# Patient Record
Sex: Female | Born: 1985
Health system: Southern US, Community
[De-identification: ages and names within clinical notes are randomized; demographics above are authoritative.]

## PROBLEM LIST (undated history)

## (undated) DIAGNOSIS — N926 Irregular menstruation, unspecified: Secondary | ICD-10-CM

## (undated) DIAGNOSIS — O139 Gestational [pregnancy-induced] hypertension without significant proteinuria, unspecified trimester: Secondary | ICD-10-CM

## (undated) DIAGNOSIS — D229 Melanocytic nevi, unspecified: Secondary | ICD-10-CM

## (undated) DIAGNOSIS — G43909 Migraine, unspecified, not intractable, without status migrainosus: Secondary | ICD-10-CM

## (undated) DIAGNOSIS — Z3009 Encounter for other general counseling and advice on contraception: Secondary | ICD-10-CM

## (undated) DIAGNOSIS — L732 Hidradenitis suppurativa: Secondary | ICD-10-CM

## (undated) DIAGNOSIS — K589 Irritable bowel syndrome without diarrhea: Secondary | ICD-10-CM

## (undated) HISTORY — DX: Hidradenitis suppurativa: L73.2

## (undated) HISTORY — PX: CHOLECYSTECTOMY: SHX55

## (undated) HISTORY — DX: Encounter for other general counseling and advice on contraception: Z30.09

## (undated) HISTORY — DX: Irregular menstruation, unspecified: N92.6

## (undated) HISTORY — DX: Gestational (pregnancy-induced) hypertension without significant proteinuria, unspecified trimester: O13.9

## (undated) HISTORY — DX: Melanocytic nevi, unspecified: D22.9

---

## 2010-12-12 ENCOUNTER — Other Ambulatory Visit (HOSPITAL_COMMUNITY)
Admission: RE | Admit: 2010-12-12 | Discharge: 2010-12-12 | Disposition: A | Discharge status: Home / Self Care | Payer: Self-pay | Source: Ambulatory Visit | Attending: Obstetrics and Gynecology | Admitting: Obstetrics and Gynecology

## 2010-12-12 DIAGNOSIS — Z01419 Encounter for gynecological examination (general) (routine) without abnormal findings: Secondary | ICD-10-CM | POA: Insufficient documentation

## 2011-06-28 LAB — OB RESULTS CONSOLE ANTIBODY SCREEN: Antibody Screen: NEGATIVE

## 2011-06-28 LAB — OB RESULTS CONSOLE GC/CHLAMYDIA
Chlamydia: NEGATIVE
Gonorrhea: NEGATIVE

## 2011-06-28 LAB — OB RESULTS CONSOLE ABO/RH

## 2012-01-18 LAB — OB RESULTS CONSOLE GC/CHLAMYDIA: Gonorrhea: NEGATIVE

## 2012-01-18 LAB — OB RESULTS CONSOLE GBS: GBS: NEGATIVE

## 2012-01-31 ENCOUNTER — Inpatient Hospital Stay (HOSPITAL_COMMUNITY)
Admission: AD | Admit: 2012-01-31 | Discharge: 2012-02-04 | DRG: 766 | Disposition: A | Discharge status: Home / Self Care | Payer: 59 | Source: Ambulatory Visit | Attending: Obstetrics & Gynecology | Admitting: Obstetrics & Gynecology

## 2012-01-31 ENCOUNTER — Telehealth (HOSPITAL_COMMUNITY): Payer: Self-pay | Admitting: *Deleted

## 2012-01-31 ENCOUNTER — Encounter (HOSPITAL_COMMUNITY): Payer: Self-pay | Admitting: *Deleted

## 2012-01-31 ENCOUNTER — Encounter (HOSPITAL_COMMUNITY): Payer: Self-pay | Admitting: Anesthesiology

## 2012-01-31 DIAGNOSIS — O99892 Other specified diseases and conditions complicating childbirth: Secondary | ICD-10-CM

## 2012-01-31 DIAGNOSIS — K5900 Constipation, unspecified: Secondary | ICD-10-CM | POA: Diagnosis not present

## 2012-01-31 HISTORY — DX: Migraine, unspecified, not intractable, without status migrainosus: G43.909

## 2012-01-31 HISTORY — DX: Irritable bowel syndrome, unspecified: K58.9

## 2012-01-31 LAB — URINALYSIS, ROUTINE W REFLEX MICROSCOPIC
Bilirubin Urine: NEGATIVE
Leukocytes, UA: NEGATIVE
Nitrite: NEGATIVE
Specific Gravity, Urine: 1.025 (ref 1.005–1.030)
Urobilinogen, UA: 0.2 mg/dL (ref 0.0–1.0)

## 2012-01-31 LAB — URINE MICROSCOPIC-ADD ON

## 2012-01-31 LAB — CBC
Hemoglobin: 12.6 g/dL (ref 12.0–15.0)
MCH: 27.2 pg (ref 26.0–34.0)
MCHC: 34.2 g/dL (ref 30.0–36.0)
MCV: 79.5 fL (ref 78.0–100.0)
Platelets: 201 10*3/uL (ref 150–400)
RBC: 4.63 MIL/uL (ref 3.87–5.11)

## 2012-01-31 LAB — ABO/RH: ABO/RH(D): B POS

## 2012-01-31 MED ORDER — TERBUTALINE SULFATE 1 MG/ML IJ SOLN: 0.2500 mg | Freq: Once | INTRAMUSCULAR | Status: AC | PRN

## 2012-01-31 MED ORDER — PHENYLEPHRINE 40 MCG/ML (10ML) SYRINGE FOR IV PUSH (FOR BLOOD PRESSURE SUPPORT)
80.0000 ug | PREFILLED_SYRINGE | INTRAVENOUS | Status: DC | PRN
  Filled 2012-01-31: qty 5

## 2012-01-31 MED ORDER — OXYTOCIN BOLUS FROM INFUSION: 500.0000 mL | INTRAVENOUS | Status: DC

## 2012-01-31 MED ORDER — LACTATED RINGERS IV SOLN: 500.0000 mL | INTRAVENOUS | Status: DC | PRN

## 2012-01-31 MED ORDER — LACTATED RINGERS IV SOLN
INTRAVENOUS | Status: DC
  Administered 2012-01-31 (×2): via INTRAVENOUS

## 2012-01-31 MED ORDER — EPHEDRINE 5 MG/ML INJ
10.0000 mg | INTRAVENOUS | Status: AC | PRN
  Administered 2012-01-31 (×2): 10 mg via INTRAVENOUS
  Filled 2012-01-31: qty 4

## 2012-01-31 MED ORDER — FENTANYL 2.5 MCG/ML BUPIVACAINE 1/10 % EPIDURAL INFUSION (WH - ANES)
14.0000 mL/h | INTRAMUSCULAR | Status: DC
  Filled 2012-01-31: qty 125

## 2012-01-31 MED ORDER — CITRIC ACID-SODIUM CITRATE 334-500 MG/5ML PO SOLN
30.0000 mL | ORAL | Status: DC | PRN
  Administered 2012-02-01: 30 mL via ORAL
  Filled 2012-01-31: qty 15

## 2012-01-31 MED ORDER — OXYCODONE-ACETAMINOPHEN 5-325 MG PO TABS: 1.0000 | ORAL_TABLET | ORAL | Status: DC | PRN

## 2012-01-31 MED ORDER — IBUPROFEN 600 MG PO TABS: 600.0000 mg | ORAL_TABLET | Freq: Four times a day (QID) | ORAL | Status: DC | PRN

## 2012-01-31 MED ORDER — LACTATED RINGERS IV SOLN: 500.0000 mL | Freq: Once | INTRAVENOUS | Status: DC

## 2012-01-31 MED ORDER — ACETAMINOPHEN 325 MG PO TABS: 650.0000 mg | ORAL_TABLET | ORAL | Status: DC | PRN

## 2012-01-31 MED ORDER — EPHEDRINE 5 MG/ML INJ: 10.0000 mg | INTRAVENOUS | Status: DC | PRN

## 2012-01-31 MED ORDER — FENTANYL 2.5 MCG/ML BUPIVACAINE 1/10 % EPIDURAL INFUSION (WH - ANES)
INTRAMUSCULAR | Status: DC | PRN
  Administered 2012-01-31: 14 mL/h via EPIDURAL

## 2012-01-31 MED ORDER — DIPHENHYDRAMINE HCL 50 MG/ML IJ SOLN: 12.5000 mg | INTRAMUSCULAR | Status: DC | PRN

## 2012-01-31 MED ORDER — OXYTOCIN 40 UNITS IN LACTATED RINGERS INFUSION - SIMPLE MED
1.0000 m[IU]/min | INTRAVENOUS | Status: DC
  Administered 2012-02-01: 2 m[IU]/min via INTRAVENOUS

## 2012-01-31 MED ORDER — PHENYLEPHRINE 40 MCG/ML (10ML) SYRINGE FOR IV PUSH (FOR BLOOD PRESSURE SUPPORT): 80.0000 ug | PREFILLED_SYRINGE | INTRAVENOUS | Status: DC | PRN

## 2012-01-31 MED ORDER — OXYTOCIN 40 UNITS IN LACTATED RINGERS INFUSION - SIMPLE MED
62.5000 mL/h | INTRAVENOUS | Status: DC
  Filled 2012-01-31: qty 1000

## 2012-01-31 MED ORDER — LIDOCAINE HCL (PF) 1 % IJ SOLN: 30.0000 mL | INTRAMUSCULAR | Status: DC | PRN

## 2012-01-31 MED ORDER — LIDOCAINE HCL (PF) 1 % IJ SOLN
INTRAMUSCULAR | Status: DC | PRN
  Administered 2012-01-31 (×2): 9 mL

## 2012-01-31 MED ORDER — FLEET ENEMA 7-19 GM/118ML RE ENEM: 1.0000 | ENEMA | RECTAL | Status: DC | PRN

## 2012-01-31 MED ORDER — ONDANSETRON HCL 4 MG/2ML IJ SOLN: 4.0000 mg | Freq: Four times a day (QID) | INTRAMUSCULAR | Status: DC | PRN

## 2012-01-31 NOTE — Anesthesia Preprocedure Evaluation (Signed)
Anesthesia Evaluation  Patient identified by MRN, date of birth, ID band Patient awake    Reviewed: Allergy & Precautions, H&P , NPO status , Patient's Chart, lab work & pertinent test results  Airway Mallampati: III TM Distance: >3 FB Neck ROM: full    Dental No notable dental hx.    Pulmonary neg pulmonary ROS,    Pulmonary exam normal       Cardiovascular     Neuro/Psych negative psych ROS   GI/Hepatic negative GI ROS, Neg liver ROS,   Endo/Other  Morbid obesity  Renal/GU negative Renal ROS  negative genitourinary   Musculoskeletal negative musculoskeletal ROS (+)   Abdominal (+) + obese,   Peds negative pediatric ROS (+)  Hematology negative hematology ROS (+)   Anesthesia Other Findings   Reproductive/Obstetrics (+) Pregnancy                           Anesthesia Physical Anesthesia Plan  ASA: III  Anesthesia Plan: Epidural   Post-op Pain Management:    Induction:   Airway Management Planned:   Additional Equipment:   Intra-op Plan:   Post-operative Plan:   Informed Consent: I have reviewed the patients History and Physical, chart, labs and discussed the procedure including the risks, benefits and alternatives for the proposed anesthesia with the patient or authorized representative who has indicated his/her understanding and acceptance.     Plan Discussed with:   Anesthesia Plan Comments:         Anesthesia Quick Evaluation

## 2012-01-31 NOTE — Progress Notes (Signed)
Mary Pham is a 27 y.o. G1P0000 at [redacted]w[redacted]d by admitted for active labor  Subjective: Doing well, requesting epidural.  Objective: BP 136/78  Pulse 95  Temp 97.7 F (36.5 C) (Oral)  Resp 20  Ht 5' 4.75" (1.645 m)  Wt 122.38 kg (269 lb 12.8 oz)  BMI 45.24 kg/m2  SpO2 100%  Breastfeeding? Unknown      FHT:  FHR: 135 bpm, variability: moderate,  accelerations:  Present,  decelerations:  Absent UC:   irregular, every 4-6 minutes SVE:   Dilation: 6 Effacement (%): 60 Station: -3 Exam by:: L. Leftwich Kirby  Labs: Lab Results  Component Value Date   WBC 12.1* 01/31/2012   HGB 12.6 01/31/2012   HCT 36.8 01/31/2012   MCV 79.5 01/31/2012   PLT 201 01/31/2012    Assessment / Plan: Spontaneous labor, progressing slowly  Labor: progressing slowly, will augment with pitocin after epidural Fetal Wellbeing:  Category I Pain Control:  Epidural I/D:  n/a PIH: Pressures have been doing well, patient is not pre-eclamptic Anticipated MOD:  NSVD  Alita Waldren 01/31/2012, 10:28 PM

## 2012-01-31 NOTE — Telephone Encounter (Signed)
Preadmission screen  

## 2012-01-31 NOTE — MAU Provider Note (Signed)
Chief Complaint:  Labor Eval  HPI: Mary Pham is a 27 y.o. G1P0000 at [redacted]w[redacted]d who presents to maternity admissions reporting cervical dilation and stripping of her membranes early in clinic this AM.  Pt is having contractions about every 5-10 minutes lasting about 30 seconds or so.  No leakage of fluid but is having some spot bleeding.  Denies SOB, abdominal pain, dysuria.    Pregnancy Course: GHTN.  Otherwise uncomplicated.   Past Medical History: Past Medical History  Diagnosis Date  . Pregnancy induced hypertension   . Migraines   . IBS (irritable bowel syndrome)     Past obstetric history: OB History    Grav Para Term Preterm Abortions TAB SAB Ect Mult Living   1 0 0 0 0 0 0 0 0 0      # Outc Date GA Lbr Len/2nd Wgt Sex Del Anes PTL Lv   1 CUR               Past Surgical History: Past Surgical History  Procedure Date  . Cholecystectomy     Family History: Family History  Problem Relation Age of Onset  . Hypertension Father   . Diabetes Father   . Heart disease Maternal Grandmother   . Heart disease Maternal Grandfather   . Cancer Paternal Grandmother     lung  . Stroke Paternal Grandfather     Social History: History  Substance Use Topics  . Smoking status: Never Smoker   . Smokeless tobacco: Never Used  . Alcohol Use: No    Allergies: No Known Allergies  Meds:  Prescriptions prior to admission  Medication Sig Dispense Refill  . butalbital-acetaminophen-caffeine (FIORICET, ESGIC) 50-325-40 MG per tablet Take 1 tablet by mouth 2 (two) times daily as needed.      . cyclobenzaprine (FLEXERIL) 10 MG tablet Take 10 mg by mouth 3 (three) times daily as needed. migrain      . omeprazole (PRILOSEC) 10 MG capsule Take 10 mg by mouth daily.        ROS: Pertinent findings in history of present illness.  Physical Exam  Blood pressure 129/70, pulse 91, temperature 99.4 F (37.4 C), temperature source Oral, resp. rate 20, height 5' 4.75" (1.645 m), weight 122.38 kg  (269 lb 12.8 oz), SpO2 100.00%, unknown if currently breastfeeding. GENERAL: Well-developed, well-nourished female in no acute distress.  HEENT: normocephalic HEART: normal rate RESP: normal effort ABDOMEN: Soft, non-tender, gravid appropriate for gestational age EXTREMITIES: Nontender, no edema NEURO: alert and oriented  Dilation: 3 Effacement (%): 50 Cervical Position: Middle Station: -2 Presentation: Vertex Exam by:: L. Paschal, RN  FHT:  Baseline 145 , moderate variability, accelerations present, no decelerations Contractions: q 3-5 mins   Labs: Results for orders placed during the hospital encounter of 01/31/12 (from the past 24 hour(s))  URINALYSIS, ROUTINE W REFLEX MICROSCOPIC     Status: Abnormal   Collection Time   01/31/12  4:30 PM      Component Value Range   Color, Urine YELLOW  YELLOW   APPearance HAZY (*) CLEAR   Specific Gravity, Urine 1.025  1.005 - 1.030   pH 6.5  5.0 - 8.0   Glucose, UA NEGATIVE  NEGATIVE mg/dL   Hgb urine dipstick MODERATE (*) NEGATIVE   Bilirubin Urine NEGATIVE  NEGATIVE   Ketones, ur 15 (*) NEGATIVE mg/dL   Protein, ur NEGATIVE  NEGATIVE mg/dL   Urobilinogen, UA 0.2  0.0 - 1.0 mg/dL   Nitrite NEGATIVE  NEGATIVE  Leukocytes, UA NEGATIVE  NEGATIVE  URINE MICROSCOPIC-ADD ON     Status: Abnormal   Collection Time   01/31/12  4:30 PM      Component Value Range   Squamous Epithelial / LPF RARE  RARE   WBC, UA 3-6  <3 WBC/hpf   RBC / HPF 3-6  <3 RBC/hpf   Bacteria, UA MANY (*) RARE   Crystals CA OXALATE CRYSTALS (*) NEGATIVE   Urine-Other MUCOUS PRESENT      MAU Course:  1) FHT category one and continues to have intermittent irregular contractions.  Will continue to monitor and possibly admit for labor.    Assessment: Early Labor with stripping of membranes   Plan: Possible admission for progression of labor.  However, if no improvement may send home and come back for IOL scheduled for tomorrow.  Labor precautions and fetal kick  counts    Medication List     As of 01/31/2012  5:58 PM    ASK your doctor about these medications         butalbital-acetaminophen-caffeine 50-325-40 MG per tablet   Commonly known as: FIORICET, ESGIC   Take 1 tablet by mouth 2 (two) times daily as needed.      cyclobenzaprine 10 MG tablet   Commonly known as: FLEXERIL   Take 10 mg by mouth 3 (three) times daily as needed. migrain      omeprazole 10 MG capsule   Commonly known as: PRILOSEC   Take 10 mg by mouth daily.        Briscoe Deutscher, DO 01/31/2012 5:58 PM

## 2012-01-31 NOTE — MAU Note (Signed)
Pt was seen in Dr. Rayna Sexton office this am for appt.  Md stripped membranes and pt had some vaginal bleeding in the office and spotting since the office visit.  U/C's started 1330 and have become more intense and closer together.  No ROM, vaginal spotting only, good fetal movement.

## 2012-01-31 NOTE — H&P (Signed)
Please see MAU provider note for H&P.    Addendum: Pt on repeat cervical exam progressing to 4 cm, 60% effacement.  Having regular contractions.  Will bring in for normal labor and delivery.   Twana First Paulina Fusi, DO of [facility] 01/31/2012, 6:55 PM

## 2012-01-31 NOTE — Anesthesia Procedure Notes (Signed)
Epidural Patient location during procedure: OB Start time: 01/31/2012 10:51 PM End time: 01/31/2012 10:57 PM  Staffing Anesthesiologist: Sandrea Hughs Performed by: anesthesiologist   Preanesthetic Checklist Completed: patient identified, site marked, surgical consent, pre-op evaluation, timeout performed, IV checked, risks and benefits discussed and monitors and equipment checked  Epidural Patient position: sitting Prep: site prepped and draped and DuraPrep Patient monitoring: continuous pulse ox and blood pressure Approach: midline Injection technique: LOR air  Needle:  Needle type: Tuohy  Needle gauge: 17 G Needle length: 9 cm and 9 Needle insertion depth: 8 cm Catheter type: closed end flexible Catheter size: 19 Gauge Catheter at skin depth: 14 cm Test dose: negative and Other  Assessment Sensory level: T8 Events: blood not aspirated, injection not painful, no injection resistance, negative IV test and no paresthesia  Additional Notes Reason for block:procedure for pain

## 2012-01-31 NOTE — Progress Notes (Signed)
Called provider to inform of decels, low b/ps and interventions.  Will wait a little while before starting pitocin per provider

## 2012-02-01 ENCOUNTER — Encounter (HOSPITAL_COMMUNITY)
Admission: AD | Disposition: A | Discharge status: Home / Self Care | Payer: Self-pay | Source: Ambulatory Visit | Attending: Obstetrics & Gynecology

## 2012-02-01 ENCOUNTER — Inpatient Hospital Stay (HOSPITAL_COMMUNITY): Admission: RE | Admit: 2012-02-01 | Payer: 59 | Source: Ambulatory Visit

## 2012-02-01 ENCOUNTER — Encounter (HOSPITAL_COMMUNITY): Payer: Self-pay | Admitting: Anesthesiology

## 2012-02-01 ENCOUNTER — Inpatient Hospital Stay (HOSPITAL_COMMUNITY): Payer: 59 | Admitting: Anesthesiology

## 2012-02-01 ENCOUNTER — Encounter (HOSPITAL_COMMUNITY): Payer: Self-pay | Admitting: *Deleted

## 2012-02-01 DIAGNOSIS — K5900 Constipation, unspecified: Secondary | ICD-10-CM

## 2012-02-01 LAB — URINE CULTURE: Colony Count: NO GROWTH

## 2012-02-01 LAB — RPR: RPR Ser Ql: NONREACTIVE

## 2012-02-01 LAB — PREPARE RBC (CROSSMATCH)

## 2012-02-01 SURGERY — Surgical Case
Anesthesia: Epidural | Site: Abdomen | Wound class: Clean Contaminated

## 2012-02-01 MED ORDER — WITCH HAZEL-GLYCERIN EX PADS: 1.0000 "application " | MEDICATED_PAD | CUTANEOUS | Status: DC | PRN

## 2012-02-01 MED ORDER — TETANUS-DIPHTH-ACELL PERTUSSIS 5-2.5-18.5 LF-MCG/0.5 IM SUSP: 0.5000 mL | Freq: Once | INTRAMUSCULAR | Status: DC

## 2012-02-01 MED ORDER — LACTATED RINGERS IV SOLN
INTRAVENOUS | Status: DC
  Administered 2012-02-01: 14:00:00 via INTRAVENOUS

## 2012-02-01 MED ORDER — PRENATAL MULTIVITAMIN CH
1.0000 | ORAL_TABLET | Freq: Every day | ORAL | Status: DC
  Administered 2012-02-02 – 2012-02-04 (×3): 1 via ORAL
  Filled 2012-02-01 (×3): qty 1

## 2012-02-01 MED ORDER — KETOROLAC TROMETHAMINE 30 MG/ML IJ SOLN: 15.0000 mg | Freq: Once | INTRAMUSCULAR | Status: DC | PRN

## 2012-02-01 MED ORDER — LACTATED RINGERS IV SOLN
INTRAVENOUS | Status: DC | PRN
  Administered 2012-02-01 (×3): via INTRAVENOUS

## 2012-02-01 MED ORDER — OXYTOCIN 40 UNITS IN LACTATED RINGERS INFUSION - SIMPLE MED: 62.5000 mL/h | INTRAVENOUS | Status: AC

## 2012-02-01 MED ORDER — SIMETHICONE 80 MG PO CHEW
80.0000 mg | CHEWABLE_TABLET | ORAL | Status: DC | PRN
  Administered 2012-02-02 – 2012-02-04 (×4): 80 mg via ORAL

## 2012-02-01 MED ORDER — IBUPROFEN 600 MG PO TABS
600.0000 mg | ORAL_TABLET | Freq: Four times a day (QID) | ORAL | Status: DC
  Administered 2012-02-01 – 2012-02-04 (×12): 600 mg via ORAL
  Filled 2012-02-01 (×11): qty 1

## 2012-02-01 MED ORDER — PHENYLEPHRINE 40 MCG/ML (10ML) SYRINGE FOR IV PUSH (FOR BLOOD PRESSURE SUPPORT)
PREFILLED_SYRINGE | INTRAVENOUS | Status: AC
  Filled 2012-02-01: qty 15

## 2012-02-01 MED ORDER — METOCLOPRAMIDE HCL 5 MG/ML IJ SOLN: 10.0000 mg | Freq: Three times a day (TID) | INTRAMUSCULAR | Status: DC | PRN

## 2012-02-01 MED ORDER — KETOROLAC TROMETHAMINE 30 MG/ML IJ SOLN
30.0000 mg | Freq: Four times a day (QID) | INTRAMUSCULAR | Status: AC | PRN
  Administered 2012-02-01: 30 mg via INTRAVENOUS
  Filled 2012-02-01: qty 1

## 2012-02-01 MED ORDER — ONDANSETRON HCL 4 MG PO TABS: 4.0000 mg | ORAL_TABLET | ORAL | Status: DC | PRN

## 2012-02-01 MED ORDER — SODIUM BICARBONATE 8.4 % IV SOLN
INTRAVENOUS | Status: AC
  Filled 2012-02-01: qty 50

## 2012-02-01 MED ORDER — MENTHOL 3 MG MT LOZG: 1.0000 | LOZENGE | OROMUCOSAL | Status: DC | PRN

## 2012-02-01 MED ORDER — MEPERIDINE HCL 25 MG/ML IJ SOLN: 6.2500 mg | INTRAMUSCULAR | Status: DC | PRN

## 2012-02-01 MED ORDER — HYDROMORPHONE HCL PF 1 MG/ML IJ SOLN
0.2500 mg | INTRAMUSCULAR | Status: DC | PRN
  Administered 2012-02-01: 0.5 mg via INTRAVENOUS

## 2012-02-01 MED ORDER — MORPHINE SULFATE 0.5 MG/ML IJ SOLN
INTRAMUSCULAR | Status: AC
  Filled 2012-02-01: qty 10

## 2012-02-01 MED ORDER — ONDANSETRON HCL 4 MG/2ML IJ SOLN
INTRAMUSCULAR | Status: DC | PRN
  Administered 2012-02-01: 4 mg via INTRAVENOUS

## 2012-02-01 MED ORDER — SODIUM CHLORIDE 0.9 % IJ SOLN: 3.0000 mL | INTRAMUSCULAR | Status: DC | PRN

## 2012-02-01 MED ORDER — ONDANSETRON HCL 4 MG/2ML IJ SOLN
INTRAMUSCULAR | Status: AC
  Filled 2012-02-01: qty 2

## 2012-02-01 MED ORDER — MEPERIDINE HCL 25 MG/ML IJ SOLN
INTRAMUSCULAR | Status: AC
  Administered 2012-02-01: 12.5 mg via INTRAVENOUS
  Filled 2012-02-01: qty 1

## 2012-02-01 MED ORDER — MEASLES, MUMPS & RUBELLA VAC ~~LOC~~ INJ
0.5000 mL | INJECTION | Freq: Once | SUBCUTANEOUS | Status: DC
  Filled 2012-02-01: qty 0.5

## 2012-02-01 MED ORDER — LACTATED RINGERS IV SOLN
INTRAVENOUS | Status: DC | PRN
  Administered 2012-02-01: 02:00:00 via INTRAVENOUS

## 2012-02-01 MED ORDER — SCOPOLAMINE 1 MG/3DAYS TD PT72
1.0000 | MEDICATED_PATCH | Freq: Once | TRANSDERMAL | Status: AC
  Administered 2012-02-01: 1.5 mg via TRANSDERMAL

## 2012-02-01 MED ORDER — DIBUCAINE 1 % RE OINT: 1.0000 "application " | TOPICAL_OINTMENT | RECTAL | Status: DC | PRN

## 2012-02-01 MED ORDER — LANOLIN HYDROUS EX OINT: 1.0000 "application " | TOPICAL_OINTMENT | CUTANEOUS | Status: DC | PRN

## 2012-02-01 MED ORDER — DIPHENHYDRAMINE HCL 25 MG PO CAPS: 25.0000 mg | ORAL_CAPSULE | ORAL | Status: DC | PRN

## 2012-02-01 MED ORDER — DIPHENHYDRAMINE HCL 25 MG PO CAPS: 25.0000 mg | ORAL_CAPSULE | Freq: Four times a day (QID) | ORAL | Status: DC | PRN

## 2012-02-01 MED ORDER — OXYTOCIN 10 UNIT/ML IJ SOLN
40.0000 [IU] | INTRAVENOUS | Status: DC | PRN
  Administered 2012-02-01: 40 [IU] via INTRAVENOUS

## 2012-02-01 MED ORDER — MORPHINE SULFATE (PF) 0.5 MG/ML IJ SOLN
INTRAMUSCULAR | Status: DC | PRN
  Administered 2012-02-01 (×2): .5 mg via EPIDURAL

## 2012-02-01 MED ORDER — PROMETHAZINE HCL 25 MG/ML IJ SOLN: 6.2500 mg | INTRAMUSCULAR | Status: DC | PRN

## 2012-02-01 MED ORDER — CEFAZOLIN SODIUM-DEXTROSE 2-3 GM-% IV SOLR
INTRAVENOUS | Status: DC | PRN
  Administered 2012-02-01: 2 g via INTRAVENOUS

## 2012-02-01 MED ORDER — OXYCODONE-ACETAMINOPHEN 5-325 MG PO TABS
1.0000 | ORAL_TABLET | ORAL | Status: DC | PRN
  Administered 2012-02-02 (×2): 1 via ORAL
  Administered 2012-02-02: 2 via ORAL
  Administered 2012-02-03 (×3): 1 via ORAL
  Administered 2012-02-03: 2 via ORAL
  Administered 2012-02-03 – 2012-02-04 (×4): 1 via ORAL
  Filled 2012-02-01 (×4): qty 1
  Filled 2012-02-01 (×2): qty 2
  Filled 2012-02-01 (×6): qty 1

## 2012-02-01 MED ORDER — MORPHINE SULFATE (PF) 0.5 MG/ML IJ SOLN
INTRAMUSCULAR | Status: DC | PRN
  Administered 2012-02-01: 4 mg via EPIDURAL

## 2012-02-01 MED ORDER — NALOXONE HCL 1 MG/ML IJ SOLN
1.0000 ug/kg/h | INTRAVENOUS | Status: DC | PRN
  Filled 2012-02-01: qty 2

## 2012-02-01 MED ORDER — HYDROMORPHONE HCL PF 1 MG/ML IJ SOLN
INTRAMUSCULAR | Status: AC
  Administered 2012-02-01: 0.5 mg via INTRAVENOUS
  Filled 2012-02-01: qty 1

## 2012-02-01 MED ORDER — ONDANSETRON HCL 4 MG/2ML IJ SOLN: 4.0000 mg | INTRAMUSCULAR | Status: DC | PRN

## 2012-02-01 MED ORDER — LIDOCAINE-EPINEPHRINE (PF) 2 %-1:200000 IJ SOLN
INTRAMUSCULAR | Status: AC
  Filled 2012-02-01: qty 20

## 2012-02-01 MED ORDER — OXYTOCIN 10 UNIT/ML IJ SOLN
INTRAMUSCULAR | Status: AC
  Filled 2012-02-01: qty 4

## 2012-02-01 MED ORDER — KETOROLAC TROMETHAMINE 60 MG/2ML IM SOLN
60.0000 mg | Freq: Once | INTRAMUSCULAR | Status: AC | PRN
  Administered 2012-02-01: 60 mg via INTRAMUSCULAR

## 2012-02-01 MED ORDER — MEPERIDINE HCL 25 MG/ML IJ SOLN
6.2500 mg | INTRAMUSCULAR | Status: DC | PRN
  Administered 2012-02-01: 12.5 mg via INTRAVENOUS

## 2012-02-01 MED ORDER — DIPHENHYDRAMINE HCL 50 MG/ML IJ SOLN: 12.5000 mg | INTRAMUSCULAR | Status: DC | PRN

## 2012-02-01 MED ORDER — KETOROLAC TROMETHAMINE 60 MG/2ML IM SOLN
INTRAMUSCULAR | Status: AC
  Administered 2012-02-01: 60 mg via INTRAMUSCULAR
  Filled 2012-02-01: qty 2

## 2012-02-01 MED ORDER — 0.9 % SODIUM CHLORIDE (POUR BTL) OPTIME
TOPICAL | Status: DC | PRN
  Administered 2012-02-01: 1000 mL

## 2012-02-01 MED ORDER — SCOPOLAMINE 1 MG/3DAYS TD PT72
MEDICATED_PATCH | TRANSDERMAL | Status: AC
  Administered 2012-02-01: 1.5 mg via TRANSDERMAL
  Filled 2012-02-01: qty 1

## 2012-02-01 MED ORDER — KETOROLAC TROMETHAMINE 30 MG/ML IJ SOLN: 30.0000 mg | Freq: Four times a day (QID) | INTRAMUSCULAR | Status: AC | PRN

## 2012-02-01 MED ORDER — SODIUM BICARBONATE 8.4 % IV SOLN
INTRAVENOUS | Status: DC | PRN
  Administered 2012-02-01: 10 mL via EPIDURAL

## 2012-02-01 MED ORDER — NALBUPHINE SYRINGE 5 MG/0.5 ML
5.0000 mg | INJECTION | INTRAMUSCULAR | Status: DC | PRN
  Administered 2012-02-01: 10 mg via SUBCUTANEOUS
  Filled 2012-02-01: qty 1

## 2012-02-01 MED ORDER — NALOXONE HCL 0.4 MG/ML IJ SOLN
0.4000 mg | INTRAMUSCULAR | Status: DC | PRN
Start: 2012-02-01 — End: 2012-02-04

## 2012-02-01 MED ORDER — PHENYLEPHRINE HCL 10 MG/ML IJ SOLN
INTRAMUSCULAR | Status: DC | PRN
  Administered 2012-02-01 (×4): 80 ug via INTRAVENOUS

## 2012-02-01 MED ORDER — NALBUPHINE SYRINGE 5 MG/0.5 ML
5.0000 mg | INJECTION | INTRAMUSCULAR | Status: DC | PRN
  Filled 2012-02-01 (×2): qty 1

## 2012-02-01 MED ORDER — DIPHENHYDRAMINE HCL 50 MG/ML IJ SOLN
25.0000 mg | INTRAMUSCULAR | Status: DC | PRN
Start: 2012-02-01 — End: 2012-02-04

## 2012-02-01 MED ORDER — ONDANSETRON HCL 4 MG/2ML IJ SOLN: 4.0000 mg | Freq: Three times a day (TID) | INTRAMUSCULAR | Status: DC | PRN

## 2012-02-01 SURGICAL SUPPLY — 38 items
CLOTH BEACON ORANGE TIMEOUT ST (SAFETY) ×2 IMPLANT
CONTAINER PREFILL 10% NBF 15ML (MISCELLANEOUS) IMPLANT
DRAIN JACKSON PRT FLT 7MM (DRAIN) IMPLANT
DRAPE LG THREE QUARTER DISP (DRAPES) ×2 IMPLANT
DRESSING TELFA 8X3 (GAUZE/BANDAGES/DRESSINGS) IMPLANT
DRSG OPSITE POSTOP 4X10 (GAUZE/BANDAGES/DRESSINGS) ×2 IMPLANT
DURAPREP 26ML APPLICATOR (WOUND CARE) ×2 IMPLANT
ELECT REM PT RETURN 9FT ADLT (ELECTROSURGICAL) ×2
ELECTRODE REM PT RTRN 9FT ADLT (ELECTROSURGICAL) ×1 IMPLANT
EVACUATOR SILICONE 100CC (DRAIN) IMPLANT
EXTRACTOR VACUUM M CUP 4 TUBE (SUCTIONS) IMPLANT
GAUZE SPONGE 4X4 12PLY STRL LF (GAUZE/BANDAGES/DRESSINGS) IMPLANT
GLOVE BIO SURGEON STRL SZ7 (GLOVE) ×2 IMPLANT
GLOVE BIO SURGEON STRL SZ8 (GLOVE) ×2 IMPLANT
GLOVE BIOGEL PI IND STRL 7.0 (GLOVE) ×2 IMPLANT
GLOVE BIOGEL PI INDICATOR 7.0 (GLOVE) ×2
GLOVE ECLIPSE 6.5 STRL STRAW (GLOVE) ×2 IMPLANT
GLOVE INDICATOR 6.5 STRL GRN (GLOVE) ×2 IMPLANT
GLOVE SKINSENSE NS SZ7.5 (GLOVE) ×1
GLOVE SKINSENSE NS SZ8.0 LF (GLOVE) ×1
GLOVE SKINSENSE STRL SZ7.5 (GLOVE) ×1 IMPLANT
GLOVE SKINSENSE STRL SZ8.0 LF (GLOVE) ×1 IMPLANT
GOWN PREVENTION PLUS LG XLONG (DISPOSABLE) ×6 IMPLANT
KIT ABG SYR 3ML LUER SLIP (SYRINGE) ×4 IMPLANT
NEEDLE HYPO 25X5/8 SAFETYGLIDE (NEEDLE) ×4 IMPLANT
NS IRRIG 1000ML POUR BTL (IV SOLUTION) ×2 IMPLANT
PACK C SECTION WH (CUSTOM PROCEDURE TRAY) ×2 IMPLANT
PAD ABD 7.5X8 STRL (GAUZE/BANDAGES/DRESSINGS) ×2 IMPLANT
PAD OB MATERNITY 4.3X12.25 (PERSONAL CARE ITEMS) ×2 IMPLANT
RTRCTR C-SECT PINK 25CM LRG (MISCELLANEOUS) ×2 IMPLANT
SLEEVE SCD COMPRESS KNEE MED (MISCELLANEOUS) ×2 IMPLANT
STAPLER VISISTAT 35W (STAPLE) ×2 IMPLANT
SUT VIC AB 0 CTX 36 (SUTURE) ×5
SUT VIC AB 0 CTX36XBRD ANBCTRL (SUTURE) ×5 IMPLANT
SUT VIC AB 4-0 KS 27 (SUTURE) IMPLANT
TOWEL OR 17X24 6PK STRL BLUE (TOWEL DISPOSABLE) ×6 IMPLANT
TRAY FOLEY CATH 14FR (SET/KITS/TRAYS/PACK) ×2 IMPLANT
WATER STERILE IRR 1000ML POUR (IV SOLUTION) IMPLANT

## 2012-02-01 NOTE — Progress Notes (Signed)
This note also relates to the following rows which could not be included: Pulse Rate - Cannot attach notes to unvalidated device data SpO2 - Cannot attach notes to unvalidated device data  Does not seem to be a bag of water questionable rupture time???

## 2012-02-01 NOTE — Progress Notes (Signed)
Adj [place] trying to find baby

## 2012-02-01 NOTE — Progress Notes (Signed)
I have seen this patient and agree with the above resident's note.  LEFTWICH-KIRBY, Ngina Royer Certified Nurse-Midwife 

## 2012-02-01 NOTE — Transfer of Care (Signed)
Immediate Anesthesia Transfer of Care Note  Patient: Mary Pham  Procedure(s) Performed: Procedure(s) (LRB) with comments: CESAREAN SECTION (N/A)  Patient Location: PACU  Anesthesia Type:Epidural  Level of Consciousness: awake, alert  and oriented  Airway & Oxygen Therapy: Patient Spontanous Breathing  Post-op Assessment: Report given to PACU RN  Post vital signs: Reviewed and stable  Complications: No apparent anesthesia complications

## 2012-02-01 NOTE — Anesthesia Postprocedure Evaluation (Signed)
  Anesthesia Post-op Note  Patient: Mary Pham  Procedure(s) Performed: Procedure(s) (LRB) with comments: CESAREAN SECTION (N/A) - Primary Cesarean Section Delivery Baby Boy @ 0221, Apgars 7/9  Patient Location: Mother/Baby  Anesthesia Type:Epidural  Level of Consciousness: awake, alert  and oriented  Airway and Oxygen Therapy: Patient Spontanous Breathing  Post-op Pain: mild  Post-op Assessment: Post-op Vital signs reviewed, Patient's Cardiovascular Status Stable, No headache, No backache, No residual numbness and No residual motor weakness  Post-op Vital Signs: Reviewed and stable  Complications: No apparent anesthesia complications

## 2012-02-01 NOTE — Progress Notes (Signed)
Mary Pham is a 27 y.o. G1P1001 at [redacted]w[redacted]d admitted for active labor  Subjective: Pt comfortable with epidural, family members at bedside for support.  Objective: BP 121/59  Pulse 91  Temp 97.6 F (36.4 C) (Oral)  Resp 19  Ht 5' 4.75" (1.645 m)  Wt 122.38 kg (269 lb 12.8 oz)  BMI 45.24 kg/m2  SpO2 99%  Breastfeeding? Unknown   Total I/O In: 1600 [I.V.:1600] Out: 900 [Urine:100; Blood:800]  FHT:  FHR: 135 bpm, variability: moderate,  accelerations:  Present,  decelerations:  Present RN called CNM and resident to room r/t fetal bradycardia vs tracing MHR.  FHR in 80s-90s at time of arrival.  UC:   regular, every 2-4 minutes SVE:   Dilation: 8 Effacement (%): 80 Station: -3 Exam by:: Leftwich kirby CNM Attempt to AROM, no fetal membranes palpable, no fluid noted, but pt having heavy bloody show. Scalp electrode placed, positive scalp stimulation with FHR 120s for 1.5-2 minutes, then back down to 80s, Dr Penne Lash paged to room.  C/S called by Dr Penne Lash when FHR remained in 80s.   Labs: Lab Results  Component Value Date   WBC 12.1* 01/31/2012   HGB 12.6 01/31/2012   HCT 36.8 01/31/2012   MCV 79.5 01/31/2012   PLT 201 01/31/2012    Assessment / Plan: Fetal bradycardia  To OR for stat C/S  LEFTWICH-KIRBY, LISA 02/01/2012, 3:56 AM

## 2012-02-01 NOTE — Consult Note (Signed)
Neonatology Note:   Attendance at C-section:    I was asked to attend this Stat primary C/S at 39 weks due to fetal bradycardia lasting 15 minutes (FHR in 70-80 range). The mother is a G1P0 B pos, GBS neg with chronic HTN, on no medication. ROM at delivery, fluid clear. Infant coughed once while the cord was being clamped, but appeared floppy, blue and without spontaneous respirations at birth.we quickly bulb suctioned for small, bloody secretions and gave vigorous stimulation. He responded promptly with grimace, then crying, improvement in color, and tone. His tone normalized by 5 minutes of life and he remained pink and without distress. Ap 7/9. Lungs clear to ausc in DR. Mild syndactyly variant of right toes 2-3 noted (father says he has webbed toes). To CN to care of Pediatrician.   Deatra James, MD

## 2012-02-01 NOTE — Op Note (Signed)
Mary Pham PROCEDURE DATE: 01/31/2012 - 02/01/2012  PREOPERATIVE DIAGNOSIS: Intrauterine pregnancy at  [redacted]w[redacted]d weeks gestation  POSTOPERATIVE DIAGNOSIS: The same  PROCEDURE:    Low Transverse Cesarean Section  SURGEON:  Dr. Elsie Lincoln  ASSISTANT: Dr. Tawanna Cooler Meisinger  INDICATIONS: Mary Pham is a 27 y.o. G1P1001 at [redacted]w[redacted]d with fetal bradycardia.  The risks of cesarean section discussed with the patient included but were not limited to: bleeding which may require transfusion or reoperation; infection which may require antibiotics; injury to bowel, bladder, ureters or other surrounding organs; injury to the fetus; need for additional procedures including hysterectomy in the event of a life-threatening hemorrhage; placental abnormalities wth subsequent pregnancies, incisional problems, thromboembolic phenomenon and other postoperative/anesthesia complications. The patient concurred with the proposed plan, giving informed written consent for the procedure.    FINDINGS:  Viable female infant in cephalic presentation, clear amniotic fluid.  Intact placenta, three vessel cord.  Grossly normal uterus, ovaries and fallopian tubes.  There was a small amount of clots upon entering the uterus which can be clinical  Signs of abruption .   ANESTHESIA:    Epidural ESTIMATED BLOOD LOSS: 800 ml SPECIMENS: Placenta sent to pathology COMPLICATIONS: None immediate  PROCEDURE IN DETAIL:  The patient received intravenous antibiotics and had sequential compression devices applied to her lower extremities while in the preoperative area.  She was then taken to the operating room where epidural anesthesia was dosed up to surgical level and was found to be adequate. She was then placed in a dorsal supine position with a leftward tilt, and prepped and draped in a sterile manner.  A foley catheter was placed into her bladder and attached to constant gravity.  After an adequate timeout was performed, a Pfannenstiel skin incision  was made with scalpel and carried through to the underlying layer of fascia. The fascia was incised in the midline and this incision was extended bilaterally using the Mayo scissors. Kocher clamps were applied to the superior aspect of the fascial incision and the underlying rectus muscles were dissected off bluntly. A similar process was carried out on the inferior aspect of the facial incision. The rectus muscles were separated in the midline bluntly and the peritoneum was entered bluntly.   A transverse hysterotomy was made with a scalpel and extended bilaterally bluntly. The bladder blade was then removed. The infant was successfully delivered, and cord was clamped and cut and infant was handed over to awaiting neonatology team. Uterine massage was then administered and the placenta delivered intact with three-vessel cord. The uterus was cleared of clot and debris.  There was an extension of the right side of the uterine incision which was clearly identified and repaired with 0-Vicryl.  The hysterotomy was closed with 0 vicryl.  There was good hemostasis at the end of the procedure.  The peritoneum and rectus muscles were noted to be hemostatic.  The fascia was closed with 0-Vicryl in a running fashion with good restoration of anatomy.  The subcutaneus tissue was copiously irrigated and re approximated with 0 plain suture.  The skin was closed with staples.  Pt tolerated the procedure will.  All counts were correct x2.  Pt went to the recovery room in stable condition.

## 2012-02-01 NOTE — Addendum Note (Signed)
Addendum  created 02/01/12 0755 by Graciela Husbands, CRNA   Modules edited:Notes Section

## 2012-02-01 NOTE — H&P (Signed)
Agree with above resident note. Napoleon Form, MD

## 2012-02-01 NOTE — MAU Provider Note (Signed)
I reviewed above note and fetal monitoring strip and agree with assessment and plan. Napoleon Form, MD

## 2012-02-01 NOTE — Progress Notes (Signed)
This note also relates to the following rows which could not be included: Pulse Rate - Cannot attach notes to unvalidated device data SpO2 - Cannot attach notes to unvalidated device data  Dr. Penne Lash in room, calls for STAT C-section due to fetal bradycardia

## 2012-02-01 NOTE — Anesthesia Postprocedure Evaluation (Signed)
Anesthesia Post Note  Patient: Mary Pham  Procedure(s) Performed: Procedure(s) (LRB): CESAREAN SECTION (N/A)  Anesthesia type: Epidural  Patient location: PACU  Post pain: Pain level controlled  Post assessment: Post-op Vital signs reviewed  Last Vitals:  Filed Vitals:   02/01/12 0345  BP: 121/59  Pulse: 91  Temp:   Resp: 19    Post vital signs: Reviewed  Level of consciousness: awake  Complications: No apparent anesthesia complications

## 2012-02-02 LAB — CBC
HCT: 31.5 % — ABNORMAL LOW (ref 36.0–46.0)
MCH: 26.6 pg (ref 26.0–34.0)
MCHC: 32.7 g/dL (ref 30.0–36.0)
MCV: 81.4 fL (ref 78.0–100.0)
RDW: 15.7 % — ABNORMAL HIGH (ref 11.5–15.5)

## 2012-02-02 NOTE — Progress Notes (Signed)
Subjective: Postpartum Day 1: Cesarean Delivery Patient reports incisional pain, tolerating PO and no problems voiding.    Objective: Vital signs in last 24 hours: Temp:  [97.6 F (36.4 C)-98.7 F (37.1 C)] 98.4 F (36.9 C) (01/11 0544) Pulse Rate:  [70-100] 75  (01/11 0544) Resp:  [18-20] 18  (01/11 0544) BP: (103-136)/(64-79) 119/75 mmHg (01/11 0544) SpO2:  [97 %-98 %] 98 % (01/11 0200)  Physical Exam:  General: alert, cooperative and no distress Lochia: appropriate Uterine Fundus: firm Incision: dressing dry DVT Evaluation: No evidence of DVT seen on physical exam.   Basename 02/02/12 0500 01/31/12 1924  HGB 10.2* 12.6  HCT 31.5* 36.8    Assessment/Plan: Status post Cesarean section. Doing well postoperatively.  Continue current care Probably go home tomorrow.  Reyansh Kushnir H 02/02/2012, 7:14 AM

## 2012-02-03 MED ORDER — DOCUSATE SODIUM 100 MG PO CAPS
100.0000 mg | ORAL_CAPSULE | Freq: Two times a day (BID) | ORAL | Status: DC | PRN
  Administered 2012-02-03 (×2): 100 mg via ORAL
  Filled 2012-02-03 (×2): qty 1

## 2012-02-03 NOTE — Progress Notes (Signed)
Subjective: Postpartum Day 2: Cesarean Delivery Patient reports incisional pain, tolerating PO and no problems voiding. Has not had BM; has IBS but would like stool softener. Bleeding is decreasing, no clots. Baby in NICU for seizures; mom would like to stay till tomorrow.  Objective: Vital signs in last 24 hours: Temp:  [97.7 F (36.5 C)-98.3 F (36.8 C)] 97.7 F (36.5 C) (01/12 0545) Pulse Rate:  [84-101] 101  (01/12 0545) Resp:  [18-20] 20  (01/12 0545) BP: (113-141)/(80-85) 141/85 mmHg (01/12 0545)  Physical Exam:  General: alert, cooperative and no distress Lochia: appropriate Uterine Fundus: firm Incision: healing well, no significant drainage, no dehiscence, no significant erythema DVT Evaluation: No evidence of DVT seen on physical exam. Negative Homan's sign. No cords or calf tenderness. Calf/Ankle edema is present.   Basename 02/02/12 0500 01/31/12 1924  HGB 10.2* 12.6  HCT 31.5* 36.8    Assessment/Plan: Status post Cesarean section. Postoperative course complicated by constipation  Continue current care. Will start colace. Discharge tomorrow.  Napoleon Form 02/03/2012, 8:30 AM

## 2012-02-04 ENCOUNTER — Encounter (HOSPITAL_COMMUNITY): Payer: Self-pay | Admitting: Obstetrics & Gynecology

## 2012-02-04 LAB — TYPE AND SCREEN: Unit division: 0

## 2012-02-04 MED ORDER — IBUPROFEN 600 MG PO TABS: 600.0000 mg | ORAL_TABLET | Freq: Four times a day (QID) | ORAL | Status: DC

## 2012-02-04 MED ORDER — DSS 100 MG PO CAPS: 100.0000 mg | ORAL_CAPSULE | Freq: Two times a day (BID) | ORAL | Status: DC | PRN

## 2012-02-04 MED ORDER — OXYCODONE-ACETAMINOPHEN 5-325 MG PO TABS: 1.0000 | ORAL_TABLET | ORAL | Status: DC | PRN

## 2012-02-04 MED ORDER — DIBUCAINE 1 % RE OINT: 1.0000 "application " | TOPICAL_OINTMENT | RECTAL | Status: DC | PRN

## 2012-02-04 NOTE — Discharge Summary (Signed)
Mary Pham is a 27 y.o. G1P0000 at [redacted]w[redacted]d who presented to maternity admissions reporting cervical dilation and stripping of her membranes early in clinic that previous morning.  Pt was monitored with FHT and was found to have persistent fetal bradycardia and taking for LTCS.  Pt did well with epidural and EBL about 800 mL.  Mom wants to breast feed.    Obstetric Discharge Summary Reason for Admission: onset of labor Prenatal Procedures: none Intrapartum Procedures: cesarean: low cervical, transverse Postpartum Procedures: none Complications-Operative and Postpartum: none Hemoglobin  Date Value Range Status  02/02/2012 10.2* 12.0 - 15.0 g/dL Final     REPEATED TO VERIFY     DELTA CHECK NOTED     HCT  Date Value Range Status  02/02/2012 31.5* 36.0 - 46.0 % Final    Physical Exam:  General: alert, cooperative and appears stated age Lochia: appropriate Uterine Fundus: firm Incision: healing well DVT Evaluation: No evidence of DVT seen on physical exam. Negative Homan's sign. No significant calf/ankle edema.  Discharge Diagnoses: Term Pregnancy-delivered  Discharge Information: Date: 02/04/2012 Activity: pelvic rest Diet: routine Medications: Colace and Percocet Condition: stable Instructions: refer to practice specific booklet Discharge to: home   Newborn Data: Live born female  Birth Weight: 6 lb 4.5 oz (2850 g) APGAR: 7, 9  Baby in NICU for seizures   Gildardo Cranker 02/04/2012, 7:24 AM

## 2012-02-04 NOTE — Progress Notes (Signed)
CSW attempted to meet with parents to offer support, complete assessment for NICU admission and discuss possible SSI eligibility, but they were busy with LC.  CSW to attempt again at a later time.

## 2012-06-18 ENCOUNTER — Other Ambulatory Visit: Payer: Self-pay | Admitting: Obstetrics & Gynecology

## 2012-06-18 DIAGNOSIS — O3680X0 Pregnancy with inconclusive fetal viability, not applicable or unspecified: Secondary | ICD-10-CM

## 2012-06-18 DIAGNOSIS — O34219 Maternal care for unspecified type scar from previous cesarean delivery: Secondary | ICD-10-CM

## 2012-06-19 ENCOUNTER — Encounter: Payer: Self-pay | Admitting: *Deleted

## 2012-06-20 ENCOUNTER — Ambulatory Visit (INDEPENDENT_AMBULATORY_CARE_PROVIDER_SITE_OTHER): Payer: 59 | Admitting: Obstetrics & Gynecology

## 2012-06-20 ENCOUNTER — Encounter: Payer: Self-pay | Admitting: Obstetrics & Gynecology

## 2012-06-20 ENCOUNTER — Other Ambulatory Visit: Payer: Self-pay

## 2012-06-20 ENCOUNTER — Ambulatory Visit (INDEPENDENT_AMBULATORY_CARE_PROVIDER_SITE_OTHER): Payer: 59

## 2012-06-20 DIAGNOSIS — O200 Threatened abortion: Secondary | ICD-10-CM

## 2012-06-20 DIAGNOSIS — O34219 Maternal care for unspecified type scar from previous cesarean delivery: Secondary | ICD-10-CM

## 2012-06-20 DIAGNOSIS — O3680X0 Pregnancy with inconclusive fetal viability, not applicable or unspecified: Secondary | ICD-10-CM

## 2012-06-20 NOTE — Addendum Note (Signed)
Addended by: Malachy Mood S on: 06/20/2012 02:00 PM   Modules accepted: Orders

## 2012-06-20 NOTE — Addendum Note (Signed)
Addended by: Colen Darling on: 06/20/2012 01:50 PM   Modules accepted: Orders

## 2012-06-20 NOTE — Progress Notes (Signed)
Had HCG 2 weeks ago with other labs, sonogram revels empty uterus.  Recheck quant HCG today and follow up

## 2012-06-21 LAB — HCG, QUANTITATIVE, PREGNANCY: hCG, Beta Chain, Quant, S: <2 m[IU]/mL

## 2012-06-23 ENCOUNTER — Telehealth: Payer: Self-pay | Admitting: Obstetrics & Gynecology

## 2012-06-23 NOTE — Telephone Encounter (Signed)
I called patient and told her HCG negative

## 2012-07-02 LAB — US OB TRANSVAGINAL

## 2012-10-09 ENCOUNTER — Encounter: Payer: Self-pay | Admitting: Adult Health

## 2012-10-09 ENCOUNTER — Ambulatory Visit (INDEPENDENT_AMBULATORY_CARE_PROVIDER_SITE_OTHER): Payer: 59 | Admitting: Adult Health

## 2012-10-09 VITALS — BP 120/80 | Ht 65.0 in | Wt 252.0 lb

## 2012-10-09 DIAGNOSIS — Z3009 Encounter for other general counseling and advice on contraception: Secondary | ICD-10-CM

## 2012-10-09 DIAGNOSIS — N926 Irregular menstruation, unspecified: Secondary | ICD-10-CM

## 2012-10-09 DIAGNOSIS — Z3202 Encounter for pregnancy test, result negative: Secondary | ICD-10-CM

## 2012-10-09 HISTORY — DX: Irregular menstruation, unspecified: N92.6

## 2012-10-09 HISTORY — DX: Encounter for other general counseling and advice on contraception: Z30.09

## 2012-10-09 LAB — POCT URINE PREGNANCY: Preg Test, Ur: NEGATIVE

## 2012-10-09 LAB — COMPREHENSIVE METABOLIC PANEL
Albumin: 3.8 g/dL (ref 3.5–5.2)
Alkaline Phosphatase: 85 U/L (ref 39–117)
Glucose, Bld: 83 mg/dL (ref 70–99)
Potassium: 4.3 mEq/L (ref 3.5–5.3)
Sodium: 137 mEq/L (ref 135–145)
Total Protein: 7 g/dL (ref 6.0–8.3)

## 2012-10-09 LAB — CBC
Hemoglobin: 13.2 g/dL (ref 12.0–15.0)
MCHC: 33.9 g/dL (ref 30.0–36.0)
RBC: 5.14 MIL/uL — ABNORMAL HIGH (ref 3.87–5.11)

## 2012-10-09 NOTE — Patient Instructions (Addendum)
Ethinyl Estradiol; Etonogestrel vaginal ring What is this medicine? ETHINYL ESTRADIOL; ETONOGESTREL (ETH in il es tra DYE ole; et oh noe JES trel) vaginal ring is a flexible, vaginal ring used as a contraceptive (birth control method). This medicine combines two types of female hormones, an estrogen and a progestin. This ring is used to prevent ovulation and pregnancy. Each ring is effective for one month. This medicine may be used for other purposes; ask your health care provider or pharmacist if you have questions. What should I tell my health care provider before I take this medicine? They need to know if you have or ever had any of these conditions: -abnormal vaginal bleeding -blood vessel disease or blood clots -breast, cervical, endometrial, ovarian, liver, or uterine cancer -diabetes -gallbladder disease -heart disease or recent heart attack -high blood pressure -high cholesterol -kidney disease -liver disease -migraine headaches -stroke -systemic lupus erythematosus (SLE) -tobacco smoker -an unusual or allergic reaction to estrogens, progestins, other medicines, foods, dyes, or preservatives -pregnant or trying to get pregnant -breast-feeding How should I use this medicine? Insert the ring into your vagina as directed. Follow the directions on the prescription label. The ring will remain place for 3 weeks and is then removed for a 1-week break. A new ring is inserted 1 week after the last ring was removed, on the same day of the week. Do not use more often than directed. A patient package insert for the product will be given with each prescription and refill. Read this sheet carefully each time. The sheet may change frequently. Contact your pediatrician regarding the use of this medicine in children. Special care may be needed. This medicine has been used in female children who have started having menstrual periods. Overdosage: If you think you have taken too much of this medicine  contact a poison control center or emergency room at once. NOTE: This medicine is only for you. Do not share this medicine with others. What if I miss a dose? You will need to replace your vaginal ring once a month as directed. If the ring should slip out, or if you leave it in longer or shorter than you should, contact your health care professional for advice. What may interact with this medicine? -acetaminophen -antibiotics or medicines for infections, especially rifampin, rifabutin, rifapentine, and griseofulvin, and possibly penicillins or tetracyclines -aprepitant -ascorbic acid (vitamin C) -atorvastatin -barbiturate medicines, such as phenobarbital -bosentan -carbamazepine -caffeine -clofibrate -cyclosporine -dantrolene -doxercalciferol -felbamate -grapefruit juice -hydrocortisone -medicines for anxiety or sleeping problems, such as diazepam or temazepam -medicines for diabetes, including pioglitazone -modafinil -mycophenolate -nefazodone -oxcarbazepine -phenytoin -prednisolone -ritonavir or other medicines for HIV infection or AIDS -rosuvastatin -selegiline -soy isoflavones supplements -St. John's wort -tamoxifen or raloxifene -theophylline -thyroid hormones -topiramate -warfarin This list may not describe all possible interactions. Give your health care provider a list of all the medicines, herbs, non-prescription drugs, or dietary supplements you use. Also tell them if you smoke, drink alcohol, or use illegal drugs. Some items may interact with your medicine. What should I watch for while using this medicine? Visit your doctor or health care professional for regular checks on your progress. You will need a regular breast and pelvic exam and Pap smear while on this medicine. Use an additional method of contraception during the first cycle that you use this ring. If you have any reason to think you are pregnant, stop using this medicine right away and contact your  doctor or health care professional. If you are using this   medicine for hormone related problems, it may take several cycles of use to see improvement in your condition. Smoking increases the risk of getting a blood clot or having a stroke while you are using hormonal birth control, especially if you are more than 25 years old. You are strongly advised not to smoke. This medicine can make your body retain fluid, making your fingers, hands, or ankles swell. Your blood pressure can go up. Contact your doctor or health care professional if you feel you are retaining fluid. This medicine can make you more sensitive to the sun. Keep out of the sun. If you cannot avoid being in the sun, wear protective clothing and use sunscreen. Do not use sun lamps or tanning beds/booths. If you wear contact lenses and notice visual changes, or if the lenses begin to feel uncomfortable, consult your eye care specialist. In some women, tenderness, swelling, or minor bleeding of the gums may occur. Notify your dentist if this happens. Brushing and flossing your teeth regularly may help limit this. See your dentist regularly and inform your dentist of the medicines you are taking. If you are going to have elective surgery, you may need to stop using this medicine before the surgery. Consult your health care professional for advice. This medicine does not protect you against HIV infection (AIDS) or any other sexually transmitted diseases. What side effects may I notice from receiving this medicine? Side effects that you should report to your doctor or health care professional as soon as possible: -breast tissue changes or discharge -changes in vaginal bleeding during your period or between your periods -chest pain -coughing up blood -dizziness or fainting spells -headaches or migraines -leg, arm or groin pain -severe or sudden headaches -stomach pain (severe) -sudden shortness of breath -sudden loss of coordination,  especially on one side of the body -speech problems -symptoms of vaginal infection like itching, irritation or unusual discharge -tenderness in the upper abdomen -vomiting -weakness or numbness in the arms or legs, especially on one side of the body -yellowing of the eyes or skin Side effects that usually do not require medical attention (report to your doctor or health care professional if they continue or are bothersome): -breakthrough bleeding and spotting that continues beyond the 3 initial cycles of pills -breast tenderness -mood changes, anxiety, depression, frustration, anger, or emotional outbursts -increased sensitivity to sun or ultraviolet light -nausea -skin rash, acne, or brown spots on the skin -weight gain (slight) This list may not describe all possible side effects. Call your doctor for medical advice about side effects. You may report side effects to FDA at 1-800-FDA-1088. Where should I keep my medicine? Keep out of the reach of children. Store at room temperature between 15 and 30 degrees C (59 and 86 degrees F) for up to 4 months. The product will expire after 4 months. Protect from light. Throw away any unused medicine after the expiration date. NOTE: This sheet is a summary. It may not cover all possible information. If you have questions about this medicine, talk to your doctor, pharmacist, or health care provider.  2013, [employer]. (12/25/2007 12:03:58 PM) Etonogestrel implant What is this medicine? ETONOGESTREL is a contraceptive (birth control) device. It is used to prevent pregnancy. It can be used for up to 3 years. This medicine may be used for other purposes; ask your health care provider or pharmacist if you have questions. What should I tell my health care provider before I take this medicine? They need to  know if you have any of these conditions: -abnormal vaginal bleeding -blood vessel disease or blood clots -cancer of the breast, cervix,  or liver -depression -diabetes -gallbladder disease -headaches -heart disease or recent heart attack -high blood pressure -high cholesterol -kidney disease -liver disease -renal disease -seizures -tobacco smoker -an unusual or allergic reaction to etonogestrel, other hormones, anesthetics or antiseptics, medicines, foods, dyes, or preservatives -pregnant or trying to get pregnant -breast-feeding How should I use this medicine? This device is inserted just under the skin on the inner side of your upper arm by a health care professional. Talk to your pediatrician regarding the use of this medicine in children. Special care may be needed. Overdosage: If you think you've taken too much of this medicine contact a poison control center or emergency room at once. Overdosage: If you think you have taken too much of this medicine contact a poison control center or emergency room at once. NOTE: This medicine is only for you. Do not share this medicine with others. What if I miss a dose? This does not apply. What may interact with this medicine? Do not take this medicine with any of the following medications: -amprenavir -bosentan -fosamprenavir This medicine may also interact with the following medications: -barbiturate medicines for inducing sleep or treating seizures -certain medicines for fungal infections like ketoconazole and itraconazole -griseofulvin -medicines to treat seizures like carbamazepine, felbamate, oxcarbazepine, phenytoin, topiramate -modafinil -phenylbutazone -rifampin -some medicines to treat HIV infection like atazanavir, indinavir, lopinavir, nelfinavir, tipranavir, ritonavir -St. John's wort This list may not describe all possible interactions. Give your health care provider a list of all the medicines, herbs, non-prescription drugs, or dietary supplements you use. Also tell them if you smoke, drink alcohol, or use illegal drugs. Some items may interact with your  medicine. What should I watch for while using this medicine? This product does not protect you against HIV infection (AIDS) or other sexually transmitted diseases. You should be able to feel the implant by pressing your fingertips over the skin where it was inserted. Tell your doctor if you cannot feel the implant. What side effects may I notice from receiving this medicine? Side effects that you should report to your doctor or health care professional as soon as possible: -allergic reactions like skin rash, itching or hives, swelling of the face, lips, or tongue -breast lumps -changes in vision -confusion, trouble speaking or understanding -dark urine -depressed mood -general ill feeling or flu-like symptoms -light-colored stools -loss of appetite, nausea -right upper belly pain -severe headaches -severe pain, swelling, or tenderness in the abdomen -shortness of breath, chest pain, swelling in a leg -signs of pregnancy -sudden numbness or weakness of the face, arm or leg -trouble walking, dizziness, loss of balance or coordination -unusual vaginal bleeding, discharge -unusually weak or tired -yellowing of the eyes or skin Side effects that usually do not require medical attention (Report these to your doctor or health care professional if they continue or are bothersome.): -acne -breast pain -changes in weight -cough -fever or chills -headache -irregular menstrual bleeding -itching, burning, and vaginal discharge -pain or difficulty passing urine -sore throat This list may not describe all possible side effects. Call your doctor for medical advice about side effects. You may report side effects to FDA at 1-800-FDA-1088. Where should I keep my medicine? This drug is given in a hospital or clinic and will not be stored at home. NOTE: This sheet is a summary. It may not cover all possible information. If you  have questions about this medicine, talk to your doctor, pharmacist, or  health care provider.  2013, Elsevier/Gold Standard. (10/01/2008 3:54:17 PM) Pt  To call once decision made about [facility] Use condoms

## 2012-10-09 NOTE — Progress Notes (Signed)
Subjective:     Patient ID: Mary Pham, female   DOB: 01-Oct-1985, 27 y.o.   MRN: [id number]  HPI Mary Pham is a 27 year old white female married in complaining of irregular bleeding on sprintec so she stopped it and is using condoms. She has been on since 8/31 and has some clots.She is interested in nexplanon or nuva ring.  Review of Systems See HPI Reviewed past medical,surgical, social and family history. Reviewed medications and allergies.     Objective:   Physical Exam BP 120/80  Ht 5\' 5"  (1.651 m)  Wt 252 lb (114.306 kg)  BMI 41.93 kg/m2  LMP 09/21/2012  Breastfeeding? Nourine pregnancy test negative.Desires Birth control and we discussed options, and she is thinking nexplanon or ring.    Assessment:     Irregular bleeding Contraceptive counseling    Plan:     Check CBC,CMP and TSH   Review handout on nexplanon and ring, and check insurance We will talk in am and she will call when she decides Use condoms for now

## 2012-10-10 ENCOUNTER — Telehealth: Payer: Self-pay | Admitting: Adult Health

## 2012-10-10 NOTE — Telephone Encounter (Signed)
Pt aware of labs  

## 2012-10-13 ENCOUNTER — Encounter: Payer: Self-pay | Admitting: Women's Health

## 2012-10-13 ENCOUNTER — Ambulatory Visit (INDEPENDENT_AMBULATORY_CARE_PROVIDER_SITE_OTHER): Payer: 59 | Admitting: Women's Health

## 2012-10-13 ENCOUNTER — Other Ambulatory Visit: Payer: 59

## 2012-10-13 VITALS — BP 120/80 | Ht 65.0 in | Wt 250.0 lb

## 2012-10-13 DIAGNOSIS — Z3200 Encounter for pregnancy test, result unknown: Secondary | ICD-10-CM

## 2012-10-13 DIAGNOSIS — Z30017 Encounter for initial prescription of implantable subdermal contraceptive: Secondary | ICD-10-CM

## 2012-10-13 NOTE — Patient Instructions (Signed)
Keep the area clean and dry.  You can remove the big bandage in 24 hours, and the small steri-strip bandage in 3-5 days.  A back up method, such as condoms, should be used for two weeks. You may have irregular vaginal bleeding for the first 6 months after the Nexplanon is placed, then the bleeding usually lightens and it is possible that you may not have any periods.  If you have any concerns, please give [place] a call.    Etonogestrel implant- Nexplanon What is this medicine? ETONOGESTREL is a contraceptive (birth control) device. It is used to prevent pregnancy. It can be used for up to 3 years. This medicine may be used for other purposes; ask your health care provider or pharmacist if you have questions. What should I tell my health care provider before I take this medicine? They need to know if you have any of these conditions: -abnormal vaginal bleeding -blood vessel disease or blood clots -cancer of the breast, cervix, or liver -depression -diabetes -gallbladder disease -headaches -heart disease or recent heart attack -high blood pressure -high cholesterol -kidney disease -liver disease -renal disease -seizures -tobacco smoker -an unusual or allergic reaction to etonogestrel, other hormones, anesthetics or antiseptics, medicines, foods, dyes, or preservatives -pregnant or trying to get pregnant -breast-feeding How should I use this medicine? This device is inserted just under the skin on the inner side of your upper arm by a health care professional. Talk to your pediatrician regarding the use of this medicine in children. Special care may be needed. Overdosage: If you think you've taken too much of this medicine contact a poison control center or emergency room at once. Overdosage: If you think you have taken too much of this medicine contact a poison control center or emergency room at once. NOTE: This medicine is only for you. Do not share this medicine with others. What if I  miss a dose? This does not apply. What may interact with this medicine? Do not take this medicine with any of the following medications: -amprenavir -bosentan -fosamprenavir This medicine may also interact with the following medications: -barbiturate medicines for inducing sleep or treating seizures -certain medicines for fungal infections like ketoconazole and itraconazole -griseofulvin -medicines to treat seizures like carbamazepine, felbamate, oxcarbazepine, phenytoin, topiramate -modafinil -phenylbutazone -rifampin -some medicines to treat HIV infection like atazanavir, indinavir, lopinavir, nelfinavir, tipranavir, ritonavir -St. John's wort This list may not describe all possible interactions. Give your health care provider a list of all the medicines, herbs, non-prescription drugs, or dietary supplements you use. Also tell them if you smoke, drink alcohol, or use illegal drugs. Some items may interact with your medicine. What should I watch for while using this medicine? This product does not protect you against HIV infection (AIDS) or other sexually transmitted diseases. You should be able to feel the implant by pressing your fingertips over the skin where it was inserted. Tell your doctor if you cannot feel the implant. What side effects may I notice from receiving this medicine? Side effects that you should report to your doctor or health care professional as soon as possible: -allergic reactions like skin rash, itching or hives, swelling of the face, lips, or tongue -breast lumps -changes in vision -confusion, trouble speaking or understanding -dark urine -depressed mood -general ill feeling or flu-like symptoms -light-colored stools -loss of appetite, nausea -right upper belly pain -severe headaches -severe pain, swelling, or tenderness in the abdomen -shortness of breath, chest pain, swelling in a leg -signs of  pregnancy -sudden numbness or weakness of the face, arm  or leg -trouble walking, dizziness, loss of balance or coordination -unusual vaginal bleeding, discharge -unusually weak or tired -yellowing of the eyes or skin Side effects that usually do not require medical attention (Report these to your doctor or health care professional if they continue or are bothersome.): -acne -breast pain -changes in weight -cough -fever or chills -headache -irregular menstrual bleeding -itching, burning, and vaginal discharge -pain or difficulty passing urine -sore throat This list may not describe all possible side effects. Call your doctor for medical advice about side effects. You may report side effects to FDA at 1-800-FDA-1088. Where should I keep my medicine? This drug is given in a hospital or clinic and will not be stored at home. NOTE: This sheet is a summary. It may not cover all possible information. If you have questions about this medicine, talk to your doctor, pharmacist, or health care provider.  2013, Elsevier/Gold Standard. (10/01/2008 3:54:17 PM)

## 2012-10-13 NOTE — Progress Notes (Signed)
Patient ID: Mary Pham, female   DOB: December 29, 1985, 27 y.o.   MRN: [id number] Mary Pham is a 27 y.o. year old Caucasian female here for Nexplanon insertion.  Her LMP was 09/21/12- has been bleeding x 23days, had irregular menses on sprintec, came off of it and is just using condoms now.  Had appt w/ [facility], 10/09/12 d/t this, had normal exam and labs, and desires nexplanon. Last sexual intercourse was 3wks ago, and her pregnancy test today was negative.  Risks/benefits/side effects of Nexplanon have been discussed and her questions have been answered.  Specifically, a failure rate of 01/998 has been reported, with an increased failure rate if pt takes St. John's Wort and/or antiseizure medicaitons.  Cheryln Balcom is aware of the common side effect of irregular bleeding, which the incidence of decreases over time. Pt is left-handed.  Her right arm, approximatly 4 inches proximal from the elbow, was cleansed with alcohol and anesthetized with 2cc of 2% Lidocaine.  The area was cleansed again with betadine and the Nexplanon was inserted per manufacturer's recommendations without difficulty.  A steri-strip and pressure bandage were applied.  Pt was instructed to keep the area clean and dry, remove pressure bandage in 24 hours, and keep insertion site covered with the steri-strip for 3-5 days.  Back up contraception was recommended for 2 weeks.  Follow-up PRN problems.  Marge Duncans 10/13/2012 3:01 PM

## 2012-11-27 ENCOUNTER — Other Ambulatory Visit: Payer: 59 | Admitting: Adult Health

## 2012-12-04 ENCOUNTER — Other Ambulatory Visit: Payer: 59 | Admitting: Obstetrics & Gynecology

## 2013-04-01 ENCOUNTER — Telehealth (HOSPITAL_COMMUNITY): Payer: Self-pay | Admitting: Dietician

## 2013-04-01 NOTE — Telephone Encounter (Signed)
[place] on [id number] at [phone]. Received call at [phone]. She desires appt; pt on [facility] plan. Appointment scheduled for 05/07/13 at 1500.

## 2013-04-16 ENCOUNTER — Encounter: Payer: Self-pay | Admitting: Obstetrics & Gynecology

## 2013-04-16 ENCOUNTER — Encounter (INDEPENDENT_AMBULATORY_CARE_PROVIDER_SITE_OTHER): Payer: Self-pay

## 2013-04-16 ENCOUNTER — Other Ambulatory Visit (HOSPITAL_COMMUNITY)
Admission: RE | Admit: 2013-04-16 | Discharge: 2013-04-16 | Disposition: A | Discharge status: Home / Self Care | Payer: 59 | Source: Ambulatory Visit | Attending: Obstetrics & Gynecology | Admitting: Obstetrics & Gynecology

## 2013-04-16 ENCOUNTER — Ambulatory Visit (INDEPENDENT_AMBULATORY_CARE_PROVIDER_SITE_OTHER): Payer: 59 | Admitting: Obstetrics & Gynecology

## 2013-04-16 VITALS — BP 100/90 | Ht 65.2 in | Wt 259.0 lb

## 2013-04-16 DIAGNOSIS — Z01419 Encounter for gynecological examination (general) (routine) without abnormal findings: Secondary | ICD-10-CM | POA: Insufficient documentation

## 2013-04-16 NOTE — Addendum Note (Signed)
Addended by: Linton Rump on: 04/16/2013 02:30 PM   Modules accepted: Orders

## 2013-04-16 NOTE — Progress Notes (Signed)
Patient ID: Mary Pham, female   DOB: Sep 04, 1985, 28 y.o.   MRN: [id number] Subjective:     Mary Pham is a 28 y.o. female here for a routine exam.  Patient's last menstrual period was 03/26/2013. G2P1001 Birth Control Method:  nexplanon Menstrual Calendar(currently): irregular long  Current complaints: periods.   Current acute medical issues:  none   Recent Gynecologic History Patient's last menstrual period was 03/26/2013. Last Pap: 2014,  normal Last mammogram: na,    Past Medical History  Diagnosis Date  . Pregnancy induced hypertension   . Migraines   . IBS (irritable bowel syndrome)   . Irregular bleeding 10/09/2012  . Contraceptive education 10/09/2012    Past Surgical History  Procedure Laterality Date  . Cholecystectomy    . Cesarean section  02/01/2012    Procedure: CESAREAN SECTION;  Surgeon: Guss Bunde, MD;  Location: [facility];  Service: Obstetrics;  Laterality: N/A;  Primary Cesarean Section Delivery Baby Boy @ 0221, Apgars 7/9    OB History   Grav Para Term Preterm Abortions TAB SAB Ect Mult Living   2 1 1  0 0 0 0 0 0 1      History   Social History  . Marital Status: Married    Spouse Name: N/A    Number of Children: N/A  . Years of Education: N/A   Social History Main Topics  . Smoking status: Never Smoker   . Smokeless tobacco: Never Used  . Alcohol Use: No  . Drug Use: No  . Sexual Activity: Yes    Birth Control/ Protection: None, Pill   Other Topics Concern  . None   Social History Narrative  . None    Family History  Problem Relation Age of Onset  . Hypertension Father   . Diabetes Father   . Heart disease Maternal Grandmother   . Heart disease Maternal Grandfather   . Cancer Paternal Grandmother     lung  . Stroke Paternal Grandfather      Review of Systems  Review of Systems  Constitutional: Negative for fever, chills, weight loss, malaise/fatigue and diaphoresis.  HENT: Negative for hearing loss, ear pain, nosebleeds,  congestion, sore throat, neck pain, tinnitus and ear discharge.   Eyes: Negative for blurred vision, double vision, photophobia, pain, discharge and redness.  Respiratory: Negative for cough, hemoptysis, sputum production, shortness of breath, wheezing and stridor.   Cardiovascular: Negative for chest pain, palpitations, orthopnea, claudication, leg swelling and PND.  Gastrointestinal: negative for abdominal pain. Negative for heartburn, nausea, vomiting, diarrhea, constipation, blood in stool and melena.  Genitourinary: Negative for dysuria, urgency, frequency, hematuria and flank pain.  Musculoskeletal: Negative for myalgias, back pain, joint pain and falls.  Skin: Negative for itching and rash.  Neurological: Negative for dizziness, tingling, tremors, sensory change, speech change, focal weakness, seizures, loss of consciousness, weakness and headaches.  Endo/Heme/Allergies: Negative for environmental allergies and polydipsia. Does not bruise/bleed easily.  Psychiatric/Behavioral: Negative for depression, suicidal ideas, hallucinations, memory loss and substance abuse. The patient is not nervous/anxious and does not have insomnia.        Objective:    Physical Exam  Vitals reviewed. Constitutional: She is oriented to person, place, and time. She appears well-developed and well-nourished.  HENT:  Head: Normocephalic and atraumatic.        Right Ear: External ear normal.  Left Ear: External ear normal.  Nose: Nose normal.  Mouth/Throat: Oropharynx is clear and moist.  Eyes: Conjunctivae and EOM are normal.  Pupils are equal, round, and reactive to light. Right eye exhibits no discharge. Left eye exhibits no discharge. No scleral icterus.  Neck: Normal range of motion. Neck supple. No tracheal deviation present. No thyromegaly present.  Cardiovascular: Normal rate, regular rhythm, normal heart sounds and intact distal pulses.  Exam reveals no gallop and no friction rub.   No murmur  heard. Respiratory: Effort normal and breath sounds normal. No respiratory distress. She has no wheezes. She has no rales. She exhibits no tenderness.  GI: Soft. Bowel sounds are normal. She exhibits no distension and no mass. There is no tenderness. There is no rebound and no guarding.  Genitourinary:  Breasts no masses skin changes or nipple changes bilaterally      Vulva is normal without lesions Vagina is pink moist without discharge Cervix normal in appearance and pap is done Uterus is normal size shape and contour Adnexa is negative with normal sized ovaries   Musculoskeletal: Normal range of motion. She exhibits no edema and no tenderness.  Neurological: She is alert and oriented to person, place, and time. She has normal reflexes. She displays normal reflexes. No cranial nerve deficit. She exhibits normal muscle tone. Coordination normal.  Skin: Skin is warm and dry. No rash noted. No erythema. No pallor.  Psychiatric: She has a normal mood and affect. Her behavior is normal. Judgment and thought content normal.       Assessment:    Healthy female exam.    Plan:    Contraception: Nexplanon. Follow up in: 1 year.

## 2013-05-07 ENCOUNTER — Encounter (HOSPITAL_COMMUNITY): Payer: Self-pay | Admitting: Dietician

## 2013-05-07 NOTE — Progress Notes (Signed)
Outpatient Initial Nutrition Assessment  Date:05/07/2013   Appt Start Time: 1513  Referring Physician: [facility] Reason for Visit: obesity  Nutrition Assessment:  Height: 5' 4.75" (164.5 cm)   Weight: 262 lb (118.842 kg)   IBW: 124#  %IBW: 211% UBW: 260#  %UBW: 101# Body mass index is 43.92 kg/(m^2).  Goal Weight: 236# (10% loss of current body weight Weight hx: Wt Readings from Last 10 Encounters:  05/07/13 262 lb (118.842 kg)  04/16/13 259 lb (117.482 kg)  10/13/12 250 lb (113.399 kg)  10/09/12 252 lb (114.306 kg)  01/31/12 269 lb 12.8 oz (122.38 kg)  01/31/12 269 lb 12.8 oz (122.38 kg)   She reports she lost 25# during her pregnancy due to sickness. She reports she achieved weight of 239# 15 months after pregnancy. Preregnancy weight 252#.  Estimated nutritional needs:  Kcals/ day: 2300-25200 Protein (grams)/day: 95-119 Fluid (L)/ day: 2.3-2.5  PMH:  Past Medical History  Diagnosis Date  . Pregnancy induced hypertension   . Migraines   . IBS (irritable bowel syndrome)   . Irregular bleeding 10/09/2012  . Contraceptive education 10/09/2012    Medications:  Current Outpatient Rx  Name  Route  Sig  Dispense  Refill  . etonogestrel (NEXPLANON) 68 MG IMPL implant   Subcutaneous   Inject 1 each into the skin once.         Marland Kitchen ibuprofen (ADVIL,MOTRIN) 600 MG tablet   Oral   Take 1 tablet (600 mg total) by mouth every 6 (six) hours.   60 tablet   0     Labs: CMP     Component Value Date/Time   NA 137 10/09/2012 1453   K 4.3 10/09/2012 1453   CL 102 10/09/2012 1453   CO2 26 10/09/2012 1453   GLUCOSE 83 10/09/2012 1453   BUN 8 10/09/2012 1453   CREATININE 0.83 10/09/2012 1453   CALCIUM 9.4 10/09/2012 1453   PROT 7.0 10/09/2012 1453   ALBUMIN 3.8 10/09/2012 1453   AST 13 10/09/2012 1453   ALT 15 10/09/2012 1453   ALKPHOS 85 10/09/2012 1453   BILITOT 0.2* 10/09/2012 1453    Lipid Panel  No results found for this basename: chol, trig, hdl, cholhdl, vldl, ldlcalc      No results found for this basename: HGBA1C   Lab Results  Component Value Date   CREATININE 0.83 10/09/2012     Lifestyle/ social habits: Mary Pham resides in [place] with husband and 40 month old son. Occupation: administration at [employer].   Physical activity: inactive. She reports contemplating walking again due to nicer weather.   Nutrition hx/habits: She reports typically poor eating habits before this week. Diet previously was high in fast food and sodas. She started bringing her lunch to work this week and now eat oatmeal at breakfast. She mainly drinks water now, but reports she is "addicted to caffeine". Previous diet consisted of [place] in the morning, [employer] (2 orders of chicken tenders and 2 cheeseburgers) for lunch, and a high starch meal for dinner. Unfortunately, she does not eat a lot of vegetables, but the last time she tried them was when she was a teenager.  She reports frustration with changing habits, as she is running out of ideas of things to bring for lunch. She also snacks frequently on sweets. She is concerned about her weight and risk factors of co morbidities, given her family hx. She is contemplating beginning Weight Watchers with her mom "to hold ourselves accountable".   Diet recall:  Breakfast: special k oatmeal, water; Lunch:pnj sandwich*; Dinner: chicken parmesean; Snacks: oatmeal cookies, nabs; Beverages mainly consist of water.   Nutrition Diagnosis:  Nutrition Intervention: Nutrition rx: 1800 Kcal NAS, no sugar added diet; 3 meals per day; one snack; low calorie beverages only; Physical activity 30 minutes daily  Education/Counseling Provided: Educated pt on principles of weight management. Discussed principles of energy expenditure and how changes in diet and physical activity affect weight status. Discussed nutritional content of commonly eaten foods and suggested healthier alternatives. Educated pt on plate method and a general,  healthful diet that includes low fat dairy, lean meats, whole fruits and vegetables, and whole grains most often. Discussed importance of a healthy diet along with regular physical activity (at least 30 minutes 5 times per week) to achieve weight loss goals. Encouraged slow, moderate weight loss (0.5-2# weight loss per week) and adopting healthy lifestyle changes vs. obtaining a certain body type or weight. Encouraged weighing self weekly at a consistent day and time of choice. Showed pt functionality of MyFitnessPal and encouraged using a food diary to better track caloric intake. Used TeachBack to assess understanding.  Also provided information on [employer] Sub[employer]y Plan.   Understanding, Motivation, Ability to Follow Recommendations: Expect fair compliance.   Monitoring and Evaluation: Goals: 1) 0.2-2# wt loss per week; 2) Physical activity 30 minutes per day  Recommendations: 1) Trial different vegetables; 2) Break up physical activity into smaller, more frequent sessions; 3) Involve family in activity; 4) If bringing frozen entrees, looks for choices with less than 400 kcals and 600 mg sodium; 5) Consider fruit cups, fiber one bars, and vegetables for snacks  F/U: PRN.  Mary Pham A. Jimmye Norman, RD, LDN 05/07/2013  Appt EndTime: 8242

## 2013-11-23 ENCOUNTER — Encounter (HOSPITAL_COMMUNITY): Payer: Self-pay | Admitting: Dietician

## 2014-03-15 ENCOUNTER — Encounter: Payer: Self-pay | Admitting: Gastroenterology

## 2014-04-22 ENCOUNTER — Other Ambulatory Visit (HOSPITAL_COMMUNITY)
Admission: RE | Admit: 2014-04-22 | Discharge: 2014-04-22 | Disposition: A | Discharge status: Home / Self Care | Payer: 59 | Source: Ambulatory Visit | Attending: Obstetrics & Gynecology | Admitting: Obstetrics & Gynecology

## 2014-04-22 ENCOUNTER — Encounter: Payer: Self-pay | Admitting: Obstetrics & Gynecology

## 2014-04-22 ENCOUNTER — Ambulatory Visit (INDEPENDENT_AMBULATORY_CARE_PROVIDER_SITE_OTHER): Payer: 59 | Admitting: Obstetrics & Gynecology

## 2014-04-22 VITALS — BP 110/80 | HR 76 | Ht 65.0 in | Wt 277.0 lb

## 2014-04-22 DIAGNOSIS — Z01419 Encounter for gynecological examination (general) (routine) without abnormal findings: Secondary | ICD-10-CM

## 2014-04-22 NOTE — Progress Notes (Signed)
Patient ID: Mary Pham, female   DOB: 07/06/1985, 29 y.o.   MRN: [id number] Subjective:     Mary Pham is a 29 y.o. female here for a routine exam.  No LMP recorded. Patient has had an implant. G2P1001 Birth Control Method:  nexplanon Menstrual Calendar(currently): amenorrhiec  Current complaints: none.   Current acute medical issues:  none   Recent Gynecologic History No LMP recorded. Patient has had an implant. Last Pap: 2015,  normal Last mammogram: ,    Past Medical History  Diagnosis Date  . Pregnancy induced hypertension   . Migraines   . IBS (irritable bowel syndrome)   . Irregular bleeding 10/09/2012  . Contraceptive education 10/09/2012    Past Surgical History  Procedure Laterality Date  . Cholecystectomy    . Cesarean section  02/01/2012    Procedure: CESAREAN SECTION;  Surgeon: Guss Bunde, MD;  Location: [facility];  Service: Obstetrics;  Laterality: N/A;  Primary Cesarean Section Delivery Baby Boy @ 0221, Apgars 7/9    OB History    Gravida Para Term Preterm AB TAB SAB Ectopic Multiple Living   2 1 1  0 0 0 0 0 0 1      History   Social History  . Marital Status: Married    Spouse Name: N/A  . Number of Children: N/A  . Years of Education: N/A   Social History Main Topics  . Smoking status: Never Smoker   . Smokeless tobacco: Never Used  . Alcohol Use: No  . Drug Use: No  . Sexual Activity: Yes    Birth Control/ Protection: None, Pill   Other Topics Concern  . None   Social History Narrative    Family History  Problem Relation Age of Onset  . Hypertension Father   . Diabetes Father   . Heart disease Maternal Grandmother   . Heart disease Maternal Grandfather   . Cancer Paternal Grandmother     lung  . Stroke Paternal Grandfather      Current outpatient prescriptions:  .  etonogestrel (NEXPLANON) 68 MG IMPL implant, Inject 1 each into the skin once., Disp: , Rfl:  .  ibuprofen (ADVIL,MOTRIN) 600 MG tablet, Take 1 tablet (600 mg total)  by mouth every 6 (six) hours. (Patient not taking: Reported on 04/22/2014), Disp: 60 tablet, Rfl: 0  Review of Systems  Review of Systems  Constitutional: Negative for fever, chills, weight loss, malaise/fatigue and diaphoresis.  HENT: Negative for hearing loss, ear pain, nosebleeds, congestion, sore throat, neck pain, tinnitus and ear discharge.   Eyes: Negative for blurred vision, double vision, photophobia, pain, discharge and redness.  Respiratory: Negative for cough, hemoptysis, sputum production, shortness of breath, wheezing and stridor.   Cardiovascular: Negative for chest pain, palpitations, orthopnea, claudication, leg swelling and PND.  Gastrointestinal: negative for abdominal pain. Negative for heartburn, nausea, vomiting, diarrhea, constipation, blood in stool and melena.  Genitourinary: Negative for dysuria, urgency, frequency, hematuria and flank pain.  Musculoskeletal: Negative for myalgias, back pain, joint pain and falls.  Skin: Negative for itching and rash.  Neurological: Negative for dizziness, tingling, tremors, sensory change, speech change, focal weakness, seizures, loss of consciousness, weakness and headaches.  Endo/Heme/Allergies: Negative for environmental allergies and polydipsia. Does not bruise/bleed easily.  Psychiatric/Behavioral: Negative for depression, suicidal ideas, hallucinations, memory loss and substance abuse. The patient is not nervous/anxious and does not have insomnia.        Objective:  Blood pressure 110/80, pulse 76, height 5\' 5"  (  1.651 m), weight 277 lb (125.646 kg).   Physical Exam  Vitals reviewed. Constitutional: She is oriented to person, place, and time. She appears well-developed and well-nourished.  HENT:  Head: Normocephalic and atraumatic.        Right Ear: External ear normal.  Left Ear: External ear normal.  Nose: Nose normal.  Mouth/Throat: Oropharynx is clear and moist.  Eyes: Conjunctivae and EOM are normal. Pupils are  equal, round, and reactive to light. Right eye exhibits no discharge. Left eye exhibits no discharge. No scleral icterus.  Neck: Normal range of motion. Neck supple. No tracheal deviation present. No thyromegaly present.  Cardiovascular: Normal rate, regular rhythm, normal heart sounds and intact distal pulses.  Exam reveals no gallop and no friction rub.   No murmur heard. Respiratory: Effort normal and breath sounds normal. No respiratory distress. She has no wheezes. She has no rales. She exhibits no tenderness.  GI: Soft. Bowel sounds are normal. She exhibits no distension and no mass. There is no tenderness. There is no rebound and no guarding.  Genitourinary:  Breasts no masses skin changes or nipple changes bilaterally      Vulva is normal without lesions Vagina is pink moist without discharge Cervix normal in appearance and pap is done Uterus is normal size shape and contour Adnexa is negative with normal sized ovaries   Musculoskeletal: Normal range of motion. She exhibits no edema and no tenderness.  Neurological: She is alert and oriented to person, place, and time. She has normal reflexes. She displays normal reflexes. No cranial nerve deficit. She exhibits normal muscle tone. Coordination normal.  Skin: Skin is warm and dry. No rash noted. No erythema. No pallor.  Psychiatric: She has a normal mood and affect. Her behavior is normal. Judgment and thought content normal.       Assessment:    Healthy female exam.    Plan:    Contraception: Nexplanon. Follow up in: 1 year.

## 2014-04-26 LAB — CYTOLOGY - PAP

## 2014-04-29 ENCOUNTER — Encounter (HOSPITAL_COMMUNITY): Payer: Self-pay | Admitting: Emergency Medicine

## 2014-04-29 ENCOUNTER — Emergency Department (HOSPITAL_COMMUNITY)
Admission: EM | Admit: 2014-04-29 | Discharge: 2014-04-29 | Disposition: A | Discharge status: Home / Self Care | Payer: 59 | Source: Home / Self Care | Attending: Family Medicine | Admitting: Family Medicine

## 2014-04-29 DIAGNOSIS — A084 Viral intestinal infection, unspecified: Secondary | ICD-10-CM

## 2014-04-29 MED ORDER — ONDANSETRON HCL 4 MG/2ML IJ SOLN
4.0000 mg | Freq: Once | INTRAMUSCULAR | Status: AC
  Administered 2014-04-29: 4 mg via INTRAMUSCULAR

## 2014-04-29 MED ORDER — ONDANSETRON HCL 4 MG/2ML IJ SOLN
INTRAMUSCULAR | Status: AC
  Filled 2014-04-29: qty 2

## 2014-04-29 MED ORDER — ONDANSETRON HCL 8 MG PO TABS: 8.0000 mg | ORAL_TABLET | Freq: Three times a day (TID) | ORAL | Status: DC | PRN

## 2014-04-29 MED ORDER — ONDANSETRON 8 MG PO TBDP: 8.0000 mg | ORAL_TABLET | Freq: Three times a day (TID) | ORAL | Status: DC | PRN

## 2014-04-29 NOTE — ED Provider Notes (Signed)
Mary Pham is a 29 y.o. female who presents to Urgent Care today for nausea vomiting diarrhea chills and lightheadedness. Symptoms present for 2 days. Patient has not tried any medications. His wife has similar symptoms. No chest pains palpitations or abdominal pain. No blood in the stool or vomit.    Past Medical History  Diagnosis Date  . Pregnancy induced hypertension   . Migraines   . IBS (irritable bowel syndrome)   . Irregular bleeding 10/09/2012  . Contraceptive education 10/09/2012   Past Surgical History  Procedure Laterality Date  . Cholecystectomy    . Cesarean section  02/01/2012    Procedure: CESAREAN SECTION;  Surgeon: Guss Bunde, MD;  Location: [facility];  Service: Obstetrics;  Laterality: N/A;  Primary Cesarean Section Delivery Baby Boy @ 0221, Apgars 7/9   History  Substance Use Topics  . Smoking status: Never Smoker   . Smokeless tobacco: Never Used  . Alcohol Use: No   ROS as above Medications: No current facility-administered medications for this encounter.   Current Outpatient Prescriptions  Medication Sig Dispense Refill  . etonogestrel (NEXPLANON) 68 MG IMPL implant Inject 1 each into the skin once.    . ondansetron (ZOFRAN ODT) 8 MG disintegrating tablet Take 1 tablet (8 mg total) by mouth every 8 (eight) hours as needed for nausea or vomiting. 20 tablet 0   No Known Allergies   Exam:  BP 131/91 mmHg  Pulse 93  Temp(Src) 97.8 F (36.6 C) (Oral)  Resp 18  SpO2 98% Gen: Well NAD HEENT: EOMI,  MMM Lungs: Normal work of breathing. CTABL Heart: RRR no MRG Abd: NABS, Soft. Nondistended, Nontender Exts: Brisk capillary refill, warm and well perfused.   Patient was given 4 mg IM Zofran, and felt better  No results found for this or any previous visit (from the past 24 hour(s)). No results found.  Assessment and Plan: 29 y.o. female with viral gastroenteritis. Treat with Zofran ODT. Return as needed.  Discussed warning signs or symptoms. Please  see discharge instructions. Patient expresses understanding.     Gregor Hams, MD 04/29/14 [phone]

## 2014-04-29 NOTE — Discharge Instructions (Signed)
Thank you for coming in today. If your belly pain worsens, or you have high fever, bad vomiting, blood in your stool or black tarry stool go to the Emergency Room.  Use Zofran as needed.   Viral Gastroenteritis Viral gastroenteritis is also known as stomach flu. This condition affects the stomach and intestinal tract. It can cause sudden diarrhea and vomiting. The illness typically lasts 3 to 8 days. Most people develop an immune response that eventually gets rid of the virus. While this natural response develops, the virus can make you quite ill. CAUSES  Many different viruses can cause gastroenteritis, such as rotavirus or noroviruses. You can catch one of these viruses by consuming contaminated food or water. You may also catch a virus by sharing utensils or other personal items with an infected person or by touching a contaminated surface. SYMPTOMS  The most common symptoms are diarrhea and vomiting. These problems can cause a severe loss of body fluids (dehydration) and a body salt (electrolyte) imbalance. Other symptoms may include:  Fever.  Headache.  Fatigue.  Abdominal pain. DIAGNOSIS  Your caregiver can usually diagnose viral gastroenteritis based on your symptoms and a physical exam. A stool sample may also be taken to test for the presence of viruses or other infections. TREATMENT  This illness typically goes away on its own. Treatments are aimed at rehydration. The most serious cases of viral gastroenteritis involve vomiting so severely that you are not able to keep fluids down. In these cases, fluids must be given through an intravenous line (IV). HOME CARE INSTRUCTIONS   Drink enough fluids to keep your urine clear or pale yellow. Drink small amounts of fluids frequently and increase the amounts as tolerated.  Ask your caregiver for specific rehydration instructions.  Avoid:  Foods high in sugar.  Alcohol.  Carbonated drinks.  Tobacco.  Juice.  Caffeine  drinks.  Extremely hot or cold fluids.  Fatty, greasy foods.  Too much intake of anything at one time.  Dairy products until 24 to 48 hours after diarrhea stops.  You may consume probiotics. Probiotics are active cultures of beneficial bacteria. They may lessen the amount and number of diarrheal stools in adults. Probiotics can be found in yogurt with active cultures and in supplements.  Wash your hands well to avoid spreading the virus.  Only take over-the-counter or prescription medicines for pain, discomfort, or fever as directed by your caregiver. Do not give aspirin to children. Antidiarrheal medicines are not recommended.  Ask your caregiver if you should continue to take your regular prescribed and over-the-counter medicines.  Keep all follow-up appointments as directed by your caregiver. SEEK IMMEDIATE MEDICAL CARE IF:   You are unable to keep fluids down.  You do not urinate at least once every 6 to 8 hours.  You develop shortness of breath.  You notice blood in your stool or vomit. This may look like coffee grounds.  You have abdominal pain that increases or is concentrated in one small area (localized).  You have persistent vomiting or diarrhea.  You have a fever.  The patient is a child younger than 3 months, and he or she has a fever.  The patient is a child older than 3 months, and he or she has a fever and persistent symptoms.  The patient is a child older than 3 months, and he or she has a fever and symptoms suddenly get worse.  The patient is a baby, and he or she has no tears when  crying. MAKE SURE YOU:   Understand these instructions.  Will watch your condition.  Will get help right away if you are not doing well or get worse. Document Released: 01/08/2005 Document Revised: 04/02/2011 Document Reviewed: 10/25/2010 [facility] Patient Information 2015 [place], [place]. This information is not intended to replace advice given to you by your health care  provider. Make sure you discuss any questions you have with your health care provider.

## 2014-04-29 NOTE — ED Notes (Signed)
C/o  Nausea, vomiting, and diarrhea.  Chills.  Symptoms present since Tuesday.   No otc treatments tried.  Denies fever.

## 2014-05-03 ENCOUNTER — Ambulatory Visit: Payer: 59 | Admitting: Gastroenterology

## 2015-02-26 DIAGNOSIS — R102 Pelvic and perineal pain: Secondary | ICD-10-CM | POA: Diagnosis not present

## 2015-02-28 ENCOUNTER — Other Ambulatory Visit (HOSPITAL_COMMUNITY): Payer: Self-pay | Admitting: General Practice

## 2015-02-28 DIAGNOSIS — R102 Pelvic and perineal pain: Secondary | ICD-10-CM

## 2015-03-03 ENCOUNTER — Ambulatory Visit (HOSPITAL_COMMUNITY)
Admission: RE | Admit: 2015-03-03 | Discharge: 2015-03-03 | Disposition: A | Discharge status: Home / Self Care | Payer: 59 | Source: Ambulatory Visit | Attending: General Practice | Admitting: General Practice

## 2015-03-03 DIAGNOSIS — R102 Pelvic and perineal pain: Secondary | ICD-10-CM | POA: Insufficient documentation

## 2015-03-10 ENCOUNTER — Inpatient Hospital Stay (HOSPITAL_COMMUNITY): Admission: RE | Admit: 2015-03-10 | Payer: 59 | Source: Ambulatory Visit

## 2015-03-10 ENCOUNTER — Other Ambulatory Visit (HOSPITAL_COMMUNITY): Payer: 59

## 2015-06-04 DIAGNOSIS — N3091 Cystitis, unspecified with hematuria: Secondary | ICD-10-CM | POA: Diagnosis not present

## 2015-06-17 DIAGNOSIS — L400 Psoriasis vulgaris: Secondary | ICD-10-CM | POA: Diagnosis not present

## 2015-06-29 MED FILL — HUMIRA PEN CROHN-UC-HS STAR: 40 | 28 days supply | Qty: 6 | Fill #0

## 2015-07-01 ENCOUNTER — Ambulatory Visit: Payer: 59 | Attending: Internal Medicine | Admitting: Pharmacist

## 2015-07-01 ENCOUNTER — Other Ambulatory Visit: Payer: Self-pay | Admitting: Pharmacist

## 2015-07-01 DIAGNOSIS — K5090 Crohn's disease, unspecified, without complications: Secondary | ICD-10-CM

## 2015-07-01 MED ORDER — ADALIMUMAB 40 MG/0.8ML ~~LOC~~ PSKT: 40.0000 mg | PREFILLED_SYRINGE | SUBCUTANEOUS | Status: DC

## 2015-07-01 NOTE — Progress Notes (Signed)
S: Patient presents today to the [facility].  Patient is currently taking Humira for Crohn's disease. Patient is managed by Dr. Willette Pa for this.   Adherence: just started the medication yesterday - she tolerated it well   Dosing:  Crohn disease: SubQ (may continue aminosalicylates and/or corticosteroids; if necessary, azathioprine, mercaptopurine, or methotrexate may also be continued): Initial: 160 mg (given as four 40 mg injections on day 1 or given as two 40 mg injections per day over 2 consecutive days), then 80 mg 2 weeks later (day 15). Maintenance: 40 mg every other week beginning day 29. Note: Some patients may require 40 mg every week as maintenance therapy Karl Pock [id number]).  Drug-drug interactions: none  Screening: TB test: employee - completed yearly.  Hepatitis: completed  Monitoring: S/sx of infection: denies CBC: monitored by Dr. Willette Pa S/sx of hypersensitivity: denies S/sx of malignancy: denies S/sx of heart failure: denies    O:     Lab Results  Component Value Date   WBC 7.8 10/09/2012   HGB 13.2 10/09/2012   HCT 38.9 10/09/2012   MCV 75.7* 10/09/2012   PLT 238 10/09/2012      Chemistry      Component Value Date/Time   NA 137 10/09/2012 1453   K 4.3 10/09/2012 1453   CL 102 10/09/2012 1453   CO2 26 10/09/2012 1453   BUN 8 10/09/2012 1453   CREATININE 0.83 10/09/2012 1453      Component Value Date/Time   CALCIUM 9.4 10/09/2012 1453   ALKPHOS 85 10/09/2012 1453   AST 13 10/09/2012 1453   ALT 15 10/09/2012 1453   BILITOT 0.2* 10/09/2012 1453       A/P: 1. Medication review: Patient started Humira yesterday with no adverse effects and tolerated the injections well. Reviewed the medication with her, including the following: Humira is a TNF blocking agent indicated for ankylosing spondylitis, Crohn's disease, Hidradenitis suppurativa, psoriatic arthritis, plaque psoriasis, ulcerative  colitis, and uveitis. The most common adverse effects are infections, headache, and injection site reactions. There is the possibility of an increased risk of malignancy but it is not well understood if this increased risk is due to there medication or the disease state. There are rare cases of pancytopenia and aplastic anemia. No recommendations for changes.    Nicoletta Ba, PharmD, BCPS, [place] [facility] [phone]  Evaluation and management procedures were performed by the Clinical Pharmacy Practitioner under my supervision and collaboration. I have reviewed the Practitioner's note and chart, and I agree with the management and plan as documented above.   Angelica Chessman, MD, [place], [place] [place] [facility] [place], [place]   07/01/2015, 4:02 PM

## 2015-07-15 ENCOUNTER — Other Ambulatory Visit: Payer: 59 | Admitting: Obstetrics & Gynecology

## 2015-07-20 MED FILL — HUMIRA 40 MG/0.8ML PSKT: 40 | 28 days supply | Qty: 4 | Fill #0

## 2015-07-21 ENCOUNTER — Other Ambulatory Visit: Payer: Self-pay | Admitting: Pharmacist

## 2015-07-21 MED ORDER — ADALIMUMAB 40 MG/0.8ML ~~LOC~~ AJKT: 40.0000 mg | AUTO-INJECTOR | SUBCUTANEOUS | Status: DC

## 2015-08-05 ENCOUNTER — Other Ambulatory Visit: Payer: 59 | Admitting: Obstetrics & Gynecology

## 2015-08-12 MED FILL — HUMIRA PEN 40 MG/0.8ML PNKT: 40 | 28 days supply | Qty: 4 | Fill #0

## 2015-08-26 ENCOUNTER — Other Ambulatory Visit (HOSPITAL_COMMUNITY)
Admission: RE | Admit: 2015-08-26 | Discharge: 2015-08-26 | Disposition: A | Discharge status: Home / Self Care | Payer: 59 | Source: Ambulatory Visit | Attending: Obstetrics & Gynecology | Admitting: Obstetrics & Gynecology

## 2015-08-26 ENCOUNTER — Encounter: Payer: Self-pay | Admitting: Obstetrics & Gynecology

## 2015-08-26 ENCOUNTER — Ambulatory Visit (INDEPENDENT_AMBULATORY_CARE_PROVIDER_SITE_OTHER): Payer: 59 | Admitting: Obstetrics & Gynecology

## 2015-08-26 VITALS — BP 120/80 | HR 85 | Ht 65.0 in | Wt 263.0 lb

## 2015-08-26 DIAGNOSIS — Z01419 Encounter for gynecological examination (general) (routine) without abnormal findings: Secondary | ICD-10-CM | POA: Insufficient documentation

## 2015-08-26 NOTE — Progress Notes (Signed)
Subjective:     Mary Pham is a 30 y.o. female here for a routine exam.  No LMP recorded. Patient has had an implant. G2P1001 Birth Control Method:  nexplanon Menstrual Calendar(currently): amenorrheic  Current complaints: none.   Current acute medical issues:  none   Recent Gynecologic History No LMP recorded. Patient has had an implant. Last Pap: 04/22/2014,  normal Last mammogram: na,    Past Medical History:  Diagnosis Date  . Contraceptive education 10/09/2012  . IBS (irritable bowel syndrome)   . Irregular bleeding 10/09/2012  . Migraines   . Pregnancy induced hypertension     Past Surgical History:  Procedure Laterality Date  . CESAREAN SECTION  02/01/2012   Procedure: CESAREAN SECTION;  Surgeon: Guss Bunde, MD;  Location: [facility];  Service: Obstetrics;  Laterality: N/A;  Primary Cesarean Section Delivery Baby Boy @ 0221, Apgars 7/9  . CHOLECYSTECTOMY      OB History    Gravida Para Term Preterm AB Living   2 1 1  0 0 1   SAB TAB Ectopic Multiple Live Births   0 0 0 0 1      Social History   Social History  . Marital status: Married    Spouse name: N/A  . Number of children: N/A  . Years of education: N/A   Social History Main Topics  . Smoking status: Never Smoker  . Smokeless tobacco: Never Used  . Alcohol use No  . Drug use: No  . Sexual activity: Yes    Birth control/ protection: Implant   Other Topics Concern  . None   Social History Narrative  . None    Family History  Problem Relation Age of Onset  . Hypertension Father   . Diabetes Father   . Heart disease Maternal Grandmother   . Heart disease Maternal Grandfather   . Cancer Paternal Grandmother     lung  . Stroke Paternal Grandfather      Current Outpatient Prescriptions:  .  Adalimumab (HUMIRA PEN) 40 MG/0.8ML PNKT, Inject 40 mg into the skin once a week., Disp: 4 each, Rfl: 3 .  etonogestrel (NEXPLANON) 68 MG IMPL implant, Inject 1 each into the skin once., Disp: , Rfl:    Review of Systems  Review of Systems  Constitutional: Negative for fever, chills, weight loss, malaise/fatigue and diaphoresis.  HENT: Negative for hearing loss, ear pain, nosebleeds, congestion, sore throat, neck pain, tinnitus and ear discharge.   Eyes: Negative for blurred vision, double vision, photophobia, pain, discharge and redness.  Respiratory: Negative for cough, hemoptysis, sputum production, shortness of breath, wheezing and stridor.   Cardiovascular: Negative for chest pain, palpitations, orthopnea, claudication, leg swelling and PND.  Gastrointestinal: negative for abdominal pain. Negative for heartburn, nausea, vomiting, diarrhea, constipation, blood in stool and melena.  Genitourinary: Negative for dysuria, urgency, frequency, hematuria and flank pain.  Musculoskeletal: Negative for myalgias, back pain, joint pain and falls.  Skin: Negative for itching and rash.  Neurological: Negative for dizziness, tingling, tremors, sensory change, speech change, focal weakness, seizures, loss of consciousness, weakness and headaches.  Endo/Heme/Allergies: Negative for environmental allergies and polydipsia. Does not bruise/bleed easily.  Psychiatric/Behavioral: Negative for depression, suicidal ideas, hallucinations, memory loss and substance abuse. The patient is not nervous/anxious and does not have insomnia.        Objective:  Blood pressure 120/80, pulse 85, height 5\' 5"  (1.651 m), weight 263 lb (119.3 kg).   Physical Exam  Vitals reviewed. Constitutional: She  is oriented to person, place, and time. She appears well-developed and well-nourished.  HENT:  Head: Normocephalic and atraumatic.        Right Ear: External ear normal.  Left Ear: External ear normal.  Nose: Nose normal.  Mouth/Throat: Oropharynx is clear and moist.  Eyes: Conjunctivae and EOM are normal. Pupils are equal, round, and reactive to light. Right eye exhibits no discharge. Left eye exhibits no discharge. No  scleral icterus.  Neck: Normal range of motion. Neck supple. No tracheal deviation present. No thyromegaly present.  Cardiovascular: Normal rate, regular rhythm, normal heart sounds and intact distal pulses.  Exam reveals no gallop and no friction rub.   No murmur heard. Respiratory: Effort normal and breath sounds normal. No respiratory distress. She has no wheezes. She has no rales. She exhibits no tenderness.  GI: Soft. Bowel sounds are normal. She exhibits no distension and no mass. There is no tenderness. There is no rebound and no guarding.  Genitourinary:  Breasts no masses skin changes or nipple changes bilaterally      Vulva is normal without lesions Vagina is pink moist without discharge Cervix normal in appearance and pap is done Uterus is normal size shape and contour Adnexa is negative with normal sized ovaries   Musculoskeletal: Normal range of motion. She exhibits no edema and no tenderness.  Neurological: She is alert and oriented to person, place, and time. She has normal reflexes. She displays normal reflexes. No cranial nerve deficit. She exhibits normal muscle tone. Coordination normal.  Skin: Skin is warm and dry. No rash noted. No erythema. No pallor.  Psychiatric: She has a normal mood and affect. Her behavior is normal. Judgment and thought content normal.       Medications Ordered at today's visit: No orders of the defined types were placed in this encounter.   Other orders placed at today's visit: No orders of the defined types were placed in this encounter.     Assessment:    Healthy female exam.    Plan:    Contraception: Nexplanon. Follow up in: 6 weeks. remove reinsert nexplanon     Return in about 6 weeks (around 10/07/2015) for Nexplanon removal and reinsertion, with Dr Elonda Husky.

## 2015-08-30 LAB — CYTOLOGY - PAP

## 2015-09-20 MED FILL — HUMIRA PEN 40 MG/0.8ML PNKT: 40 | 28 days supply | Qty: 4 | Fill #1

## 2015-10-07 ENCOUNTER — Encounter: Payer: 59 | Admitting: Obstetrics & Gynecology

## 2015-10-28 ENCOUNTER — Ambulatory Visit (INDEPENDENT_AMBULATORY_CARE_PROVIDER_SITE_OTHER): Payer: 59 | Admitting: Women's Health

## 2015-10-28 ENCOUNTER — Encounter: Payer: Self-pay | Admitting: Women's Health

## 2015-10-28 VITALS — BP 122/80 | HR 80 | Ht 65.0 in | Wt 258.4 lb

## 2015-10-28 DIAGNOSIS — L732 Hidradenitis suppurativa: Secondary | ICD-10-CM | POA: Insufficient documentation

## 2015-10-28 DIAGNOSIS — Z3202 Encounter for pregnancy test, result negative: Secondary | ICD-10-CM

## 2015-10-28 DIAGNOSIS — Z3049 Encounter for surveillance of other contraceptives: Secondary | ICD-10-CM

## 2015-10-28 DIAGNOSIS — Z3046 Encounter for surveillance of implantable subdermal contraceptive: Secondary | ICD-10-CM

## 2015-10-28 LAB — POCT URINE PREGNANCY: Preg Test, Ur: NEGATIVE

## 2015-10-28 NOTE — Progress Notes (Signed)
Mary Pham is a 30 y.o. year old Caucasian female here for Nexplanon removal.  Patient given informed consent for removal of her Nexplanon. Her Nexplanon was placed Sept 2014. She does not want birth control at this time, if gets pregnant it is ok, but not 'actively trying'. Does have h/o PCOS, and had to take coc's and metformin to conceive last time. Not currently taking pnv.   BP 122/80   Pulse 80   Ht 5\' 5"  (1.651 m)   Wt 258 lb 6.4 oz (117.2 kg)   LMP 10/10/2015   BMI 43.00 kg/m   Appropriate time out taken. Nexplanon site identified.  Area prepped in usual sterile fashon. One cc of 2% lidocaine was used to anesthetize the area at the distal end of the implant. A small stab incision was made right beside the implant on the distal portion.  The Nexplanon rod was grasped using hemostats and removed without difficulty.  There was less than 3 cc blood loss. There were no complications.  Steri-strips were applied over the small incision and a pressure bandage was applied.  The patient tolerated the procedure well.  She was instructed to keep the area clean and dry, remove pressure bandage in 24 hours, and keep insertion site covered with the steri-strip for 3-5 days.    Follow-up PRN problems. Begin pnv daily Call if pregnant Call if wanting to get pregnant and not able d/t pcos- will do coc's/metformin Call if wants another nexplanon- explained options of timing for coming in  [place], [place], [facility] 10/28/2015 11:01 AM

## 2015-10-28 NOTE — Patient Instructions (Signed)
Begin prenatal vitamins daily   If you want another Nexplanon  Come in on your period,   or 10d from last sex w/ bhcg (bloodwork) am insertion pm, or  14d from last sex w/ UPT and insertion at same time

## 2015-10-31 DIAGNOSIS — Z6841 Body Mass Index (BMI) 40.0 and over, adult: Secondary | ICD-10-CM | POA: Diagnosis not present

## 2015-10-31 DIAGNOSIS — J029 Acute pharyngitis, unspecified: Secondary | ICD-10-CM | POA: Diagnosis not present

## 2015-10-31 DIAGNOSIS — J069 Acute upper respiratory infection, unspecified: Secondary | ICD-10-CM | POA: Diagnosis not present

## 2015-11-15 MED FILL — HUMIRA PEN 40 MG/0.8ML PNKT: 40 | 28 days supply | Qty: 4 | Fill #2

## 2015-12-05 ENCOUNTER — Ambulatory Visit (INDEPENDENT_AMBULATORY_CARE_PROVIDER_SITE_OTHER): Payer: 59 | Admitting: Obstetrics and Gynecology

## 2015-12-05 ENCOUNTER — Encounter: Payer: Self-pay | Admitting: Obstetrics and Gynecology

## 2015-12-05 VITALS — BP 130/90 | HR 86 | Ht 65.0 in | Wt 262.0 lb

## 2015-12-05 DIAGNOSIS — Z30017 Encounter for initial prescription of implantable subdermal contraceptive: Secondary | ICD-10-CM | POA: Diagnosis not present

## 2015-12-05 DIAGNOSIS — Z3202 Encounter for pregnancy test, result negative: Secondary | ICD-10-CM

## 2015-12-05 DIAGNOSIS — Z3049 Encounter for surveillance of other contraceptives: Secondary | ICD-10-CM

## 2015-12-05 LAB — POCT URINE PREGNANCY: Preg Test, Ur: NEGATIVE

## 2015-12-05 NOTE — Progress Notes (Signed)
Patient ID: Mary Pham, female   DOB: June 27, 1985, 30 y.o.   MRN: [id number]  Chief Complaint  Patient presents with   implanon insertion    consent signed    [place] NOTE  Mary Pham is a 30 y.o. G2P1001 here for Nexplanon insertion. No other gynecologic concerns. LMP began this morning. Urine preg negative today.   Nexplanon Insertion Procedure Patient identified, informed consent performed, consent signed.   Patient does understand that irregular bleeding is a very common side effect of this medication. She was advised to have backup contraception for one week after placement. Pregnancy test in clinic today was negative.  Appropriate time out taken.  Patient's left arm was prepped and draped in the usual sterile fashion. The ruler used to measure and mark insertion area.  Patient was prepped with alcohol swab and then injected with 3 ml of 1% lidocaine.  She was prepped with betadine, Nexplanon removed from packaging,  Device confirmed in needle, then inserted full length of needle and withdrawn per handbook instructions. Nexplanon was able to palpated in the patient's arm; patient palpated the insert herself. There was minimal blood loss.  Patient insertion site covered with guaze and a pressure bandage to reduce any bruising.  The patient tolerated the procedure well and was given post procedure instructions.    Procedure performed by Resident, Ruthann Cancer MD, under my direct supervision and assistance.  By signing my name below, I, Hansel Feinstein, attest that this documentation has been prepared under the direction and in the presence of Jonnie Kind, MD. Electronically Signed: Hansel Feinstein, ED Scribe. 12/05/15. 3:54 PM.  I personally performed the services described in this documentation, which was SCRIBED in my presence. The recorded information has been reviewed and considered accurate. It has been edited as necessary during review. Jonnie Kind, MD

## 2015-12-05 NOTE — Patient Instructions (Signed)
neEtonogestrel implant What is this medicine? ETONOGESTREL (et oh noe JES trel) is a contraceptive (birth control) device. It is used to prevent pregnancy. It can be used for up to 3 years. This medicine may be used for other purposes; ask your health care provider or pharmacist if you have questions. What should I tell my health care provider before I take this medicine? They need to know if you have any of these conditions: -abnormal vaginal bleeding -blood vessel disease or blood clots -cancer of the breast, cervix, or liver -depression -diabetes -gallbladder disease -headaches -heart disease or recent heart attack -high blood pressure -high cholesterol -kidney disease -liver disease -renal disease -seizures -tobacco smoker -an unusual or allergic reaction to etonogestrel, other hormones, anesthetics or antiseptics, medicines, foods, dyes, or preservatives -pregnant or trying to get pregnant -breast-feeding How should I use this medicine? This device is inserted just under the skin on the inner side of your upper arm by a health care professional. Talk to your pediatrician regarding the use of this medicine in children. Special care may be needed. Overdosage: If you think you have taken too much of this medicine contact a poison control center or emergency room at once. NOTE: This medicine is only for you. Do not share this medicine with others. What if I miss a dose? This does not apply. What may interact with this medicine? Do not take this medicine with any of the following medications: -amprenavir -bosentan -fosamprenavir This medicine may also interact with the following medications: -barbiturate medicines for inducing sleep or treating seizures -certain medicines for fungal infections like ketoconazole and itraconazole -griseofulvin -medicines to treat seizures like carbamazepine, felbamate, oxcarbazepine, phenytoin,  topiramate -modafinil -phenylbutazone -rifampin -some medicines to treat HIV infection like atazanavir, indinavir, lopinavir, nelfinavir, tipranavir, ritonavir -[place] Rhett's wort This list may not describe all possible interactions. Give your health care provider a list of all the medicines, herbs, non-prescription drugs, or dietary supplements you use. Also tell them if you smoke, drink alcohol, or use illegal drugs. Some items may interact with your medicine. What should I watch for while using this medicine? This product does not protect you against HIV infection (AIDS) or other sexually transmitted diseases. You should be able to feel the implant by pressing your fingertips over the skin where it was inserted. Contact your doctor if you cannot feel the implant, and use a non-hormonal birth control method (such as condoms) until your doctor confirms that the implant is in place. If you feel that the implant may have broken or become bent while in your arm, contact your healthcare provider. What side effects may I notice from receiving this medicine? Side effects that you should report to your doctor or health care professional as soon as possible: -allergic reactions like skin rash, itching or hives, swelling of the face, lips, or tongue -breast lumps -changes in emotions or moods -depressed mood -heavy or prolonged menstrual bleeding -pain, irritation, swelling, or bruising at the insertion site -scar at site of insertion -signs of infection at the insertion site such as fever, and skin redness, pain or discharge -signs of pregnancy -signs and symptoms of a blood clot such as breathing problems; changes in vision; chest pain; severe, sudden headache; pain, swelling, warmth in the leg; trouble speaking; sudden numbness or weakness of the face, arm or leg -signs and symptoms of liver injury like dark yellow or brown urine; general ill feeling or flu-like symptoms; light-colored stools; loss of  appetite; nausea; right upper belly  pain; unusually weak or tired; yellowing of the eyes or skin -unusual vaginal bleeding, discharge -signs and symptoms of a stroke like changes in vision; confusion; trouble speaking or understanding; severe headaches; sudden numbness or weakness of the face, arm or leg; trouble walking; dizziness; loss of balance or coordination Side effects that usually do not require medical attention (Report these to your doctor or health care professional if they continue or are bothersome.): -acne -back pain -breast pain -changes in weight -dizziness -general ill feeling or flu-like symptoms -headache -irregular menstrual bleeding -nausea -sore throat -vaginal irritation or inflammation This list may not describe all possible side effects. Call your doctor for medical advice about side effects. You may report side effects to FDA at 1-800-FDA-1088. Where should I keep my medicine? This drug is given in a hospital or clinic and will not be stored at home. NOTE: This sheet is a summary. It may not cover all possible information. If you have questions about this medicine, talk to your doctor, pharmacist, or health care provider.    2016, Elsevier/Gold Standard. (2013-10-23 14:07:06)  

## 2016-01-04 DIAGNOSIS — R05 Cough: Secondary | ICD-10-CM | POA: Diagnosis not present

## 2016-01-04 DIAGNOSIS — Z6841 Body Mass Index (BMI) 40.0 and over, adult: Secondary | ICD-10-CM | POA: Diagnosis not present

## 2016-01-04 DIAGNOSIS — J209 Acute bronchitis, unspecified: Secondary | ICD-10-CM | POA: Diagnosis not present

## 2016-05-30 DIAGNOSIS — Z6841 Body Mass Index (BMI) 40.0 and over, adult: Secondary | ICD-10-CM | POA: Diagnosis not present

## 2016-05-30 DIAGNOSIS — R319 Hematuria, unspecified: Secondary | ICD-10-CM | POA: Diagnosis not present

## 2016-05-30 MED FILL — NITROFURANTOIN MONO-MCR 100: 100 | 7 days supply | Qty: 14 | Fill #0

## 2016-08-31 DIAGNOSIS — D229 Melanocytic nevi, unspecified: Secondary | ICD-10-CM | POA: Diagnosis not present

## 2016-08-31 DIAGNOSIS — L400 Psoriasis vulgaris: Secondary | ICD-10-CM | POA: Diagnosis not present

## 2016-08-31 DIAGNOSIS — L732 Hidradenitis suppurativa: Secondary | ICD-10-CM | POA: Diagnosis not present

## 2016-08-31 DIAGNOSIS — Z79899 Other long term (current) drug therapy: Secondary | ICD-10-CM | POA: Diagnosis not present

## 2016-08-31 DIAGNOSIS — L723 Sebaceous cyst: Secondary | ICD-10-CM | POA: Diagnosis not present

## 2016-09-13 ENCOUNTER — Other Ambulatory Visit: Payer: Self-pay | Admitting: Pharmacist

## 2016-09-13 MED ORDER — ADALIMUMAB 40 MG/0.8ML ~~LOC~~ AJKT: 40.0000 mg | AUTO-INJECTOR | SUBCUTANEOUS | 3 refills | Status: DC

## 2016-11-22 MED FILL — HUMIRA PEN 40 MG/0.4ML PNKT: 40 | 28 days supply | Qty: 4 | Fill #0

## 2016-12-17 ENCOUNTER — Ambulatory Visit (HOSPITAL_BASED_OUTPATIENT_CLINIC_OR_DEPARTMENT_OTHER): Payer: 59 | Admitting: Pharmacist

## 2016-12-17 ENCOUNTER — Other Ambulatory Visit: Payer: Self-pay | Admitting: Pharmacist

## 2016-12-17 DIAGNOSIS — Z7189 Other specified counseling: Secondary | ICD-10-CM

## 2016-12-17 MED ORDER — ADALIMUMAB 40 MG/0.4ML ~~LOC~~ AJKT: 1.0000 "pen " | AUTO-INJECTOR | SUBCUTANEOUS | 10 refills | Status: DC

## 2016-12-17 MED FILL — HUMIRA PEN 40 MG/0.4ML PNKT: 40 | 28 days supply | Qty: 4 | Fill #0

## 2016-12-17 NOTE — Progress Notes (Signed)
S: Patient presents today to the [facility].  Patient is currently taking Humira for Crohn's disease. Patient is managed by Dr. Willette Pa for this.   Adherence: denies any missed doses.  Efficacy: reports that it has been working very well for her Hidradenitis  Dosing: 40 mg weekly  Drug-drug interactions: none  Screening: TB test: employee - completed yearly.  Hepatitis: completed  Monitoring: S/sx of infection: denies CBC: monitored by Dr. Willette Pa per patient, will request most recent labs today. S/sx of hypersensitivity: denies S/sx of malignancy: denies S/sx of heart failure: denies  Patient reports that she did have some injection site pain so she was switched to the citrate free Humira and it has been working better.  O:     Lab Results  Component Value Date   WBC 7.8 10/09/2012   HGB 13.2 10/09/2012   HCT 38.9 10/09/2012   MCV 75.7 (L) 10/09/2012   PLT 238 10/09/2012      Chemistry      Component Value Date/Time   NA 137 10/09/2012 1453   K 4.3 10/09/2012 1453   CL 102 10/09/2012 1453   CO2 26 10/09/2012 1453   BUN 8 10/09/2012 1453   CREATININE 0.83 10/09/2012 1453      Component Value Date/Time   CALCIUM 9.4 10/09/2012 1453   ALKPHOS 85 10/09/2012 1453   AST 13 10/09/2012 1453   ALT 15 10/09/2012 1453   BILITOT 0.2 (L) 10/09/2012 1453       A/P: 1. Medication review: Patient is on Humira with no adverse effects and improved control of disease state. Reviewed the medication with her, including the following: Humira is a TNF blocking agent indicated for ankylosing spondylitis, Crohn's disease, Hidradenitis suppurativa, psoriatic arthritis, plaque psoriasis, ulcerative colitis, and uveitis. The most common adverse effects are infections, headache, and injection site reactions. There is the possibility of an increased risk of malignancy but it is not well understood if this increased risk is due to  there medication or the disease state. There are rare cases of pancytopenia and aplastic anemia. No recommendations for any changes. Will request most recent office visit notes and lab results from Dr. Bea Graff office.   Christella Hartigan, PharmD, BCPS, BCACP, [place] [facility] [phone]

## 2017-01-28 MED FILL — HUMIRA PEN 40 MG/0.4ML PNKT: 40 | 28 days supply | Qty: 4 | Fill #1

## 2017-02-19 MED FILL — CEPHALEXIN 500 MG CAPSULE: 500 | 30 days supply | Qty: 60 | Fill #0

## 2017-02-19 MED FILL — HUMIRA PEN 40 MG/0.4ML PNKT: 40 | 28 days supply | Qty: 4 | Fill #2

## 2017-03-05 MED FILL — CHERATUSSIN AC SYRUP: 100-10 | 12 days supply | Qty: 120 | Fill #0

## 2017-03-05 MED FILL — OSELTAMIVIR PHOSPHATE 75 MG: 75 | 5 days supply | Qty: 10 | Fill #0

## 2017-03-28 MED FILL — HUMIRA PEN 40 MG/0.4ML PNKT: 40 | 28 days supply | Qty: 4 | Fill #3

## 2017-04-04 ENCOUNTER — Telehealth: Payer: Self-pay | Admitting: *Deleted

## 2017-04-04 ENCOUNTER — Other Ambulatory Visit: Payer: Self-pay | Admitting: Obstetrics & Gynecology

## 2017-04-04 MED ORDER — MEGESTROL ACETATE 40 MG PO TABS: ORAL_TABLET | ORAL | 3 refills | Status: DC

## 2017-04-04 MED ORDER — KETOROLAC TROMETHAMINE 10 MG PO TABS: 10.0000 mg | ORAL_TABLET | Freq: Three times a day (TID) | ORAL | 0 refills | Status: DC | PRN

## 2017-04-04 NOTE — Telephone Encounter (Signed)
Informed patient Dr Elonda Husky stated she is having breakthrough bleeding with the Nexplanon so he prescribed Megace and Torodol for pain. Patient with no questions and verbalized understanding.

## 2017-04-04 NOTE — Telephone Encounter (Signed)
Patient states she has been on her period for 3 weeks and is having back pain. Nexplanon is in place and has not had any bleeding since 12/05/15. She is asking to be seen tomorrow. Please advise. Patient also states you may send her a Pharmacist, community message.

## 2017-04-04 NOTE — Telephone Encounter (Signed)
No need to be seen, dysfunctional bleeding from the nexplanon  I e prescribed megace and toradol to [facility], since it would probably be Monday before she would get it from [facility]  I I need to change the pharmacy let me know

## 2017-04-05 ENCOUNTER — Other Ambulatory Visit: Payer: Self-pay | Admitting: Obstetrics & Gynecology

## 2017-04-05 ENCOUNTER — Telehealth: Payer: Self-pay | Admitting: *Deleted

## 2017-04-05 MED ORDER — MEGESTROL ACETATE 40 MG PO TABS: ORAL_TABLET | ORAL | 3 refills | Status: DC

## 2017-04-05 MED ORDER — KETOROLAC TROMETHAMINE 10 MG PO TABS: 10.0000 mg | ORAL_TABLET | Freq: Three times a day (TID) | ORAL | 0 refills | Status: DC | PRN

## 2017-04-05 MED FILL — KETOROLAC 10 MG TABLET: 10 | 5 days supply | Qty: 15 | Fill #0

## 2017-04-05 MED FILL — MEGESTROL 40 MG TABLET: 40 | 30 days supply | Qty: 45 | Fill #0

## 2017-04-05 NOTE — Telephone Encounter (Signed)
Wants meds sent to [facility] outpatient

## 2017-05-16 MED FILL — HUMIRA PEN 40 MG/0.4ML PNKT: 40 | 28 days supply | Qty: 4 | Fill #4

## 2017-05-16 MED FILL — SPIRONOLACTONE 100 MG TABS: 100 | 30 days supply | Qty: 60 | Fill #0

## 2017-05-29 MED FILL — MYORISAN 40 MG CAPSULE: 40 | 30 days supply | Qty: 60 | Fill #0

## 2017-07-01 MED FILL — SPIRONOLACTONE 100 MG TABS: 100 | 30 days supply | Qty: 60 | Fill #1

## 2017-07-01 MED FILL — HUMIRA PEN 40 MG/0.4ML PNKT: 40 | 28 days supply | Qty: 4 | Fill #5

## 2017-07-02 MED FILL — MYORISAN 40 MG CAPSULE: 40 | 30 days supply | Qty: 60 | Fill #0

## 2017-07-24 MED FILL — PHENTERMINE 37.5 MG TABLET: 37.5 | 30 days supply | Qty: 30 | Fill #0

## 2017-07-24 MED FILL — SPIRONOLACTONE 100 MG TABS: 100 | 30 days supply | Qty: 60 | Fill #2

## 2017-07-24 MED FILL — HUMIRA PEN 40 MG/0.4ML PNKT: 40 | 28 days supply | Qty: 4 | Fill #6

## 2017-08-16 MED FILL — SPIRONOLACTONE 100 MG TABS: 100 | 30 days supply | Qty: 60 | Fill #3

## 2017-08-16 MED FILL — HUMIRA PEN 40 MG/0.4ML PNKT: 40 | 28 days supply | Qty: 4 | Fill #7

## 2017-09-06 MED FILL — HUMIRA PEN 40 MG/0.4ML PNKT: 40 | 28 days supply | Qty: 4 | Fill #8

## 2017-09-06 MED FILL — PHENTERMINE 37.5 MG TABLET: 37.5 | 30 days supply | Qty: 30 | Fill #1

## 2017-10-11 MED FILL — HUMIRA PEN 40 MG/0.4ML PNKT: 40 | 28 days supply | Qty: 4 | Fill #9

## 2017-10-11 MED FILL — PHENTERMINE 37.5 MG TABLET: 37.5 | 30 days supply | Qty: 30 | Fill #2

## 2017-10-11 MED FILL — CEPHALEXIN 500 MG CAPSULE: 500 | 10 days supply | Qty: 20 | Fill #0

## 2017-12-23 ENCOUNTER — Encounter (INDEPENDENT_AMBULATORY_CARE_PROVIDER_SITE_OTHER): Payer: Self-pay | Admitting: Family Medicine

## 2017-12-23 ENCOUNTER — Ambulatory Visit (INDEPENDENT_AMBULATORY_CARE_PROVIDER_SITE_OTHER): Payer: 59 | Admitting: Family Medicine

## 2017-12-23 ENCOUNTER — Ambulatory Visit (INDEPENDENT_AMBULATORY_CARE_PROVIDER_SITE_OTHER): Payer: No Typology Code available for payment source

## 2017-12-23 DIAGNOSIS — M545 Low back pain, unspecified: Secondary | ICD-10-CM

## 2017-12-23 NOTE — Progress Notes (Signed)
Office Visit Note   Patient: Mary Pham           Date of Birth: 1985/04/18           MRN: [id number] Visit Date: 12/23/2017 Requested by: Rory Percy, MD [place], [place] [address] PCP: Rory Percy, MD  Subjective: Chief Complaint  Patient presents with  . Lower Back - Pain    Slipped on wet front porch steps and fell, landing on the steps, hitting buttocks.  Pain in center of lower back.  Did have and episode of numbness in left leg yesterday - went away. H/o coccyx injury as a child.    HPI: She is a 32 year old with low back pain.  2 days ago she slipped on a step and fell down the steps bouncing down on her buttocks.  Immediate pain in her sacral area.  It has gotten a little bit better but she wants to make sure she is not break something.  When she was a child she landed on her coccyx and had pain for a year.  No problems since then.  She has occasional pain in her hips.  She attributes this to being overweight.  She has lost 30 pounds by exercising and eating better.  She is still working on weight loss.                ROS: She has a history of Crohn's disease managed with Humira.  Other systems were negative.  Objective: Vital Signs: There were no vitals taken for this visit.  Physical Exam:  Low back: No visible bruising.  No tenderness over the lumbar spinous processes or sacroiliac joints.  Moderately tender in the mid sacral area.  Negative straight leg raise, lower extremity strength and reflexes are normal.  Imaging: X-rays lumbar spine: Mild to moderate L5-S1 degenerative disc disease.  Mild spurring of the right femoral head.  Lumbar alignment is anatomic, no spondylolisthesis.  No sign of acute fracture.  No sign of sacral or coccyx fracture.   Assessment & Plan: 1.  2 days status post fall with lumbosacral contusion -Anticipate 3 to 4 weeks healing time.  Follow-up on an as-needed basis if symptoms are not improving. -Recommended continued weight  loss, along with glucosamine for hip arthritis.   Follow-Up Instructions: No follow-ups on file.      Procedures: No procedures performed  No notes on file    PMFS History: Patient Active Problem List   Diagnosis Date Noted  . Hidradenitis 10/28/2015  . Irregular bleeding 10/09/2012   Past Medical History:  Diagnosis Date  . Contraceptive education 10/09/2012  . IBS (irritable bowel syndrome)   . Irregular bleeding 10/09/2012  . Migraines   . Pregnancy induced hypertension     Family History  Problem Relation Age of Onset  . Hypertension Father   . Diabetes Father   . Heart disease Maternal Grandmother   . Heart disease Maternal Grandfather   . Cancer Paternal Grandmother        lung  . Stroke Paternal Grandfather     Past Surgical History:  Procedure Laterality Date  . CESAREAN SECTION  02/01/2012   Procedure: CESAREAN SECTION;  Surgeon: Guss Bunde, MD;  Location: [facility];  Service: Obstetrics;  Laterality: N/A;  Primary Cesarean Section Delivery Baby Boy @ 0221, Apgars 7/9  . CHOLECYSTECTOMY     Social History   Occupational History  . Not on file  Tobacco Use  . Smoking  status: Never Smoker  . Smokeless tobacco: Never Used  Substance and Sexual Activity  . Alcohol use: No  . Drug use: No  . Sexual activity: Yes    Birth control/protection: Implant

## 2017-12-23 NOTE — Patient Instructions (Signed)
    Glucosamine Sulfate:  1,000 mg twice daily  Turmeric:  500 mg twice daily (anti-inflammatory).

## 2018-01-29 MED FILL — VENTOLIN HFA 90 MCG INHALER: 108 (90 BAS | 25 days supply | Qty: 18 | Fill #0

## 2018-01-29 MED FILL — IPRATROPIUM 0.06% SPRAY: 0.06 | 21 days supply | Qty: 15 | Fill #0

## 2018-03-19 MED FILL — NITROFURANTOIN MONO-MCR 100: 100 | 7 days supply | Qty: 14 | Fill #0

## 2018-04-17 MED FILL — CLOBETASOL PROPIONATE 0.05: 0.05 | 10 days supply | Qty: 50 | Fill #0

## 2018-05-26 MED FILL — CLOBETASOL PROPIONATE 0.05: 0.05 | 10 days supply | Qty: 50 | Fill #1 | Status: TO

## 2018-06-18 MED FILL — CLOBETASOL PROPIONATE 0.05: 0.05 | 10 days supply | Qty: 50 | Fill #0

## 2018-10-01 MED FILL — predniSONE 20 MG TABS: 20 | 13 days supply | Qty: 20 | Fill #0

## 2018-10-13 ENCOUNTER — Other Ambulatory Visit: Payer: Self-pay | Admitting: Surgery

## 2018-10-13 ENCOUNTER — Other Ambulatory Visit (HOSPITAL_COMMUNITY): Payer: Self-pay | Admitting: Surgery

## 2018-10-17 ENCOUNTER — Other Ambulatory Visit (HOSPITAL_COMMUNITY): Payer: 59

## 2018-10-17 ENCOUNTER — Ambulatory Visit (HOSPITAL_COMMUNITY): Payer: No Typology Code available for payment source

## 2018-10-31 ENCOUNTER — Ambulatory Visit: Payer: Self-pay | Admitting: Dietician

## 2018-11-28 ENCOUNTER — Other Ambulatory Visit: Payer: Self-pay

## 2018-11-28 ENCOUNTER — Encounter: Payer: Self-pay | Admitting: Dietician

## 2018-11-28 ENCOUNTER — Encounter: Payer: No Typology Code available for payment source | Attending: Surgery | Admitting: Dietician

## 2018-11-28 ENCOUNTER — Ambulatory Visit (HOSPITAL_COMMUNITY)
Admission: RE | Admit: 2018-11-28 | Discharge: 2018-11-28 | Disposition: A | Discharge status: Home / Self Care | Payer: No Typology Code available for payment source | Source: Ambulatory Visit | Attending: Surgery | Admitting: Surgery

## 2018-11-28 DIAGNOSIS — E669 Obesity, unspecified: Secondary | ICD-10-CM | POA: Insufficient documentation

## 2018-11-28 NOTE — Progress Notes (Signed)
Nutrition Assessment for Bariatric Surgery Medical Nutrition Therapy  Appt Start Time: 9:15am    End Time: 10:50am  Patient was seen on 11/28/2018 for Pre-Operative Nutrition Assessment. Letter of approval faxed to [facility] bariatric surgery program coordinator on 11/28/2018.   Referral stated Supervised Weight Loss (SWL) visits needed: 0  Planned surgery: RYGB Pt expectation of surgery: to lose and maintain weight loss   NUTRITION ASSESSMENT   Anthropometrics  Start weight at [facility]: 283.5 lbs (date: 11/28/2018) Height: 65 in BMI: 47.2 kg/m2    Clinical  Medical hx: obesity, IBS, GERD, cholelithiasis Medications: see list   Lifestyle & Dietary Hx Lives with her husband and their son. 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 Excited for surgery, knows a few people who have had this surgery. Has tried dieting multiple times before and states she does really well with being disciplined and is able to lose weight. However, states that she regains weight as soon as she stops dieting.   Typical meal pattern is 2 meals per day and maybe a dessert. If has breakfast, will have on the weekends such as fast food ([place]) or bacon and eggs. Usually eats out, but may cook burgers or spaghetti at home. States she is "addicted" to soda but has cut them out completely for the past 2 weeks.   24-Hr Dietary Recall First Meal: - Snack: - Second Meal: PB&J + grapes + apples  Snack: - Third Meal: spaghetti  Snack: Klondike bar  Beverages: regular soda, sweet tea, water  Estimated Energy Needs Calories: 1800 Carbohydrate: 200g Protein: 113g Fat: 60g   NUTRITION DIAGNOSIS  Overweight/obesity ([place]-3.3) related to past poor dietary habits and physical inactivity as evidenced by patient w/ planned RYGB surgery following dietary guidelines for continued weight loss.    NUTRITION INTERVENTION  Nutrition counseling (C-1) and education (E-2) to facilitate bariatric  surgery goals.  Pre-Op Goals Reviewed with the Patient . Track food and beverage intake (pen and paper, MyFitness Pal, Baritastic app, etc.) . Make healthy food choices while monitoring portion sizes . Consume 3 meals per day or try to eat every 3-5 hours . Avoid concentrated sugars and fried foods . Keep sugar & fat in the single digits per serving on food labels . Practice CHEWING your food (aim for applesauce consistency) . Practice not drinking 15 minutes before, during, and 30 minutes after each meal and snack . Avoid all carbonated beverages (ex: soda, sparkling beverages)  . Limit caffeinated beverages (ex: coffee, tea, energy drinks) . Avoid all sugar-sweetened beverages (ex: regular soda, sports drinks)  . Avoid alcohol  . Aim for 64-100 ounces of FLUID daily (with at least half of fluid intake being plain water)  . Aim for at least 60-80 grams of PROTEIN daily . Look for a liquid protein source that contains ?15 g protein and ?5 g carbohydrate (ex: shakes, drinks, shots) . Make a list of non-food related activities . Physical activity is an important part of a healthy lifestyle so keep it moving! The goal is to reach 150 minutes of exercise per week, including cardiovascular and weight baring activity.  Handouts Provided Include  . Bariatric Surgery handouts (Nutrition Visits, Pre-Op Goals, Protein Shakes, Vitamins & Minerals)  Learning Style & Readiness for Change Teaching method utilized: Visual & Auditory  Demonstrated degree of understanding via: Teach Back  Barriers to learning/adherence to lifestyle change: None Identified   MONITORING & EVALUATION Dietary intake, weekly physical activity, body weight, and pre-op goals reached  at next nutrition visit.   Next Steps Patient is to call [facility] to enroll in Pre-Op Class (>2 weeks before surgery) and Post-Op Class (2 weeks after surgery) for further nutrition education when surgery date is scheduled.

## 2018-12-11 HISTORY — PX: OTHER SURGICAL HISTORY: SHX169

## 2018-12-12 ENCOUNTER — Other Ambulatory Visit: Payer: Self-pay

## 2018-12-12 ENCOUNTER — Ambulatory Visit (INDEPENDENT_AMBULATORY_CARE_PROVIDER_SITE_OTHER): Payer: No Typology Code available for payment source | Admitting: Adult Health

## 2018-12-12 ENCOUNTER — Encounter: Payer: Self-pay | Admitting: Adult Health

## 2018-12-12 VITALS — BP 130/93 | HR 87 | Ht 65.0 in | Wt 286.0 lb

## 2018-12-12 DIAGNOSIS — Z3046 Encounter for surveillance of implantable subdermal contraceptive: Secondary | ICD-10-CM

## 2018-12-12 DIAGNOSIS — Z3202 Encounter for pregnancy test, result negative: Secondary | ICD-10-CM

## 2018-12-12 LAB — POCT URINE PREGNANCY: Preg Test, Ur: NEGATIVE

## 2018-12-12 NOTE — Progress Notes (Signed)
  Subjective:     Patient ID: Mary Pham, female   DOB: 1986-01-03, 33 y.o.   MRN: [id number]  HPI Mary Pham is a 33 year old white female, married, G2P1001, in for nexplanon removal and reinsertion. Nexplanon inserted 12/05/15. Last pap was 08/26/15. Is preparing gor gastric by pass surgery. PCP is [facility].   Review of Systems For nexpalnon removal and reinsertion  Reviewed past medical,surgical, social and family history. Reviewed medications and allergies.     Objective:   Physical Exam BP (!) 130/93 (BP Location: Left Arm, Patient Position: Sitting, Cuff Size: Normal)   Pulse 87   Ht 5\' 5"  (1.651 m)   Wt 286 lb (129.7 kg)   BMI 47.59 kg/m  PHQ  2 score 0.Had sex 3 days ago. UPT is negative Consent signed and time out called. Right arm cleansed with betadine, and injected with 1.5 cc 2% lidocaine and waited til numb.Under sterile technique a #11 blade was used to make small vertical incision, and a curved forceps was used to easily remove rod.New rod easily inserted and palpated by provider and pt. Steri strips applied. Pressure dressing applied.    Assessment:       1. Encounter for removal and reinsertion of Nexplanon   2. Negative pregnancy test   Lot # [id number]    Plan:       Use condoms x 2 weeks, keep clean and dry x 24 hours, no heavy lifting, keep steri strips on x 72 hours, Keep pressure dressing on x 24 hours.  Follow up prn problems. Pap and physical  on 12/18 with me  Remove nexplanon in 3 years

## 2018-12-12 NOTE — Patient Instructions (Addendum)
Use condoms x 2 weeks, keep clean and dry x 24 hours, no heavy lifting, keep steri strips on x 72 hours, Keep pressure dressing on x 24 hours. Follow up prn problems. Pap and physical  on 12/18  Remove nexplanon in 3 years

## 2018-12-15 DIAGNOSIS — Z3046 Encounter for surveillance of implantable subdermal contraceptive: Secondary | ICD-10-CM | POA: Diagnosis not present

## 2018-12-15 MED ORDER — ETONOGESTREL 68 MG ~~LOC~~ IMPL
68.0000 mg | DRUG_IMPLANT | Freq: Once | SUBCUTANEOUS | Status: AC
  Administered 2018-12-15: 68 mg via SUBCUTANEOUS

## 2018-12-15 NOTE — Addendum Note (Signed)
Addended by: Octaviano Glow on: 12/15/2018 10:48 AM   Modules accepted: Orders

## 2019-01-07 ENCOUNTER — Ambulatory Visit: Payer: Self-pay | Admitting: Surgery

## 2019-01-07 ENCOUNTER — Telehealth: Payer: Self-pay | Admitting: Dietician

## 2019-01-07 NOTE — Telephone Encounter (Signed)
Patient called requesting a copy of the Bariatric Vitamins & Minerals, Protein Shakes, and Pre-Op Goals handouts. These were provided to patient via email at [email or web address].   Plan to follow up with patient at [place] on Monday, December 21st for Pre-Op Class.    [name]) Short, MS, RD, LDN

## 2019-01-09 ENCOUNTER — Other Ambulatory Visit: Payer: No Typology Code available for payment source | Admitting: Adult Health

## 2019-01-12 ENCOUNTER — Other Ambulatory Visit: Payer: Self-pay

## 2019-01-12 ENCOUNTER — Encounter: Payer: No Typology Code available for payment source | Attending: Surgery | Admitting: Skilled Nursing Facility1

## 2019-01-12 DIAGNOSIS — E669 Obesity, unspecified: Secondary | ICD-10-CM | POA: Insufficient documentation

## 2019-01-12 NOTE — Progress Notes (Signed)
Pre-Operative Nutrition Class:  Appt start time: 1219   End time:  1830.  Patient was seen on 01/12/2019 for Pre-Operative Bariatric Surgery Education [facility].   Surgery date:  Surgery type: RYGB Start weight at [facility]: 283.5 Weight today: 281  Samples given per MNT protocol. Patient educated on appropriate usage: procare Multivitamin Lot # [id number] Exp: 05/22  The following the learning objectives were met by the patient during this course:  Identify Pre-Op Dietary Goals and will begin 2 weeks pre-operatively  Identify appropriate sources of fluids and proteins   State protein recommendations and appropriate sources pre and post-operatively  Identify Post-Operative Dietary Goals and will follow for 2 weeks post-operatively  Identify appropriate multivitamin and calcium sources  Describe the need for physical activity post-operatively and will follow MD recommendations  State when to call healthcare provider regarding medication questions or post-operative complications  Handouts given during class include:  Pre-Op Bariatric Surgery Diet Handout  Protein Shake Handout  Post-Op Bariatric Surgery Nutrition Handout  [facility] Program Information Flyer  Support Group Information Flyer  [facility] Bariatric Supplements Price List  Follow-Up Plan: Patient will follow-up at [facility] 2 weeks post operatively for diet advancement per MD.

## 2019-01-14 ENCOUNTER — Ambulatory Visit (INDEPENDENT_AMBULATORY_CARE_PROVIDER_SITE_OTHER): Payer: No Typology Code available for payment source | Admitting: Psychology

## 2019-01-14 DIAGNOSIS — F509 Eating disorder, unspecified: Secondary | ICD-10-CM

## 2019-01-20 ENCOUNTER — Ambulatory Visit (INDEPENDENT_AMBULATORY_CARE_PROVIDER_SITE_OTHER): Payer: Self-pay | Admitting: Psychology

## 2019-01-26 NOTE — Patient Instructions (Addendum)
DUE TO COVID-19 ONLY ONE VISITOR IS ALLOWED TO COME WITH YOU AND STAY IN THE WAITING ROOM ONLY DURING PRE OP AND PROCEDURE DAY OF SURGERY. THE 1 VISITOR MAY VISIT WITH YOU AFTER SURGERY IN YOUR PRIVATE ROOM DURING VISITING HOURS ONLY!   ONCE YOUR COVID TEST IS COMPLETED, PLEASE BEGIN THE QUARANTINE INSTRUCTIONS AS OUTLINED IN YOUR HANDOUT.                Mary Pham    Your procedure is scheduled on: Monday 02/02/2019   Report to [facility] Main  Entrance    Report to admitting at  0730  AM     Call this number if you have problems the morning of surgery [phone]      MORNING OF SURGERY DRINK:   DRINK 1 G2 drink BEFORE YOU LEAVE HOME, DRINK ALL OF THE  G2 DRINK AT ONE TIME.     NO SOLID FOOD AFTER 600 PM THE NIGHT BEFORE YOUR SURGERY. YOU MAY DRINK CLEAR FLUIDS.    THE G2 DRINK YOU DRINK BEFORE YOU LEAVE HOME WILL BE THE LAST FLUIDS YOU DRINK BEFORE SURGERY.    PAIN IS EXPECTED AFTER SURGERY AND WILL NOT BE COMPLETELY ELIMINATED. AMBULATION AND TYLENOL WILL HELP REDUCE INCISIONAL AND GAS PAIN. MOVEMENT IS KEY!   YOU ARE EXPECTED TO BE OUT OF BED WITHIN 4 HOURS OF ADMISSION TO YOUR PATIENT ROOM.   SITTING IN THE RECLINER THROUGHOUT THE DAY IS IMPORTANT FOR DRINKING FLUIDS AND MOVING GAS THROUGHOUT THE GI TRACT.   COMPRESSION STOCKINGS SHOULD BE WORN [place] UNLESS YOU ARE WALKING.    INCENTIVE SPIROMETER SHOULD BE USED EVERY HOUR WHILE AWAKE TO DECREASE POST-OPERATIVE COMPLICATIONS SUCH AS PNEUMONIA.   WHEN DISCHARGED HOME, IT IS IMPORTANT TO CONTINUE TO WALK EVERY HOUR AND USE THE INCENTIVE SPIROMETER EVERY HOUR.       CLEAR LIQUID DIET   Foods Allowed                                                                     Foods Excluded  Coffee and tea, regular and decaf                             liquids that you cannot  Plain Jell-O any favor except red or purple                                           see through such as: Fruit  ices (not with fruit pulp)                                     milk, soups, orange juice  Iced Popsicles                                    All solid food Carbonated beverages, regular and diet  Cranberry, grape and apple juices Sports drinks like Gatorade Lightly seasoned clear broth or consume(fat free) Sugar, honey syrup  Sample Menu Breakfast                                Lunch                                     Supper Cranberry juice                    Beef broth                            Chicken broth Jell-O                                     Grape juice                           Apple juice Coffee or tea                        Jell-O                                      Popsicle                                                Coffee or tea                        Coffee or tea  _____________________________________________________________________     BRUSH YOUR TEETH MORNING OF SURGERY AND RINSE YOUR MOUTH OUT, NO CHEWING GUM CANDY OR MINTS.     Take these medicines the morning of surgery with A SIP OF WATER: none                                 You may not have any metal on your body including hair pins and              piercings  Do not wear jewelry, make-up, lotions, powders or perfumes, deodorant             Do not wear nail polish on your fingernails.  Do not shave  48 hours prior to surgery.               Do not bring valuables to the hospital. [place].  Contacts, dentures or bridgework may not be worn into surgery.  Leave suitcase in the car. After surgery it may be brought to your room.     Patients discharged the day of surgery will not be allowed to drive home. IF YOU ARE HAVING SURGERY AND GOING HOME THE SAME DAY, YOU MUST HAVE AN ADULT TO DRIVE YOU HOME AND  BE  WITH YOU FOR 24 HOURS. YOU MAY GO HOME BY TAXI OR UBER OR ORTHERWISE, BUT AN ADULT MUST ACCOMPANY YOU HOME AND STAY  WITH YOU FOR 24 HOURS.  Name and phone number of your driver:spouse-spouse- Vonna Kotyk  [phone]                Please read over the following fact sheets you were given: _____________________________________________________________________             [facility] - Preparing for Surgery Before surgery, you can play an important role.  Because skin is not sterile, your skin needs to be as free of germs as possible.  You can reduce the number of germs on your skin by washing with CHG (chlorahexidine gluconate) soap before surgery.  CHG is an antiseptic cleaner which kills germs and bonds with the skin to continue killing germs even after washing. Please DO NOT use if you have an allergy to CHG or antibacterial soaps.  If your skin becomes reddened/irritated stop using the CHG and inform your nurse when you arrive at Short Stay. Do not shave (including legs and underarms) for at least 48 hours prior to the first CHG shower.  You may shave your face/neck. Please follow these instructions carefully:  1.  Shower with CHG Soap the night before surgery and the  morning of Surgery.  2.  If you choose to wash your hair, wash your hair first as usual with your  normal  shampoo.  3.  After you shampoo, rinse your hair and body thoroughly to remove the  shampoo.                           4.  Use CHG as you would any other liquid soap.  You can apply chg directly  to the skin and wash                       Gently with a scrungie or clean washcloth.  5.  Apply the CHG Soap to your body ONLY FROM THE NECK DOWN.   Do not use on face/ open                           Wound or open sores. Avoid contact with eyes, ears mouth and genitals (private parts).                       Wash face,  Genitals (private parts) with your normal soap.             6.  Wash thoroughly, paying special attention to the area where your surgery  will be performed.  7.  Thoroughly rinse your body with warm water from the neck  down.  8.  DO NOT shower/wash with your normal soap after using and rinsing off  the CHG Soap.                9.  Pat yourself dry with a clean towel.            10.  Wear clean pajamas.            11.  Place clean sheets on your bed the night of your first shower and do not  sleep with pets. Day of Surgery : Do not apply any lotions/deodorants the morning of surgery.  Please wear clean clothes to the hospital/surgery center.  FAILURE TO FOLLOW THESE INSTRUCTIONS MAY RESULT IN THE CANCELLATION OF YOUR SURGERY PATIENT SIGNATURE_________________________________  NURSE SIGNATURE__________________________________  ________________________________________________________________________   Adam Phenix  An incentive spirometer is a tool that can help keep your lungs clear and active. This tool measures how well you are filling your lungs with each breath. Taking long deep breaths may help reverse or decrease the chance of developing breathing (pulmonary) problems (especially infection) following:  A long period of time when you are unable to move or be active. BEFORE THE PROCEDURE   If the spirometer includes an indicator to show your best effort, your nurse or respiratory therapist will set it to a desired goal.  If possible, sit up straight or lean slightly forward. Try not to slouch.  Hold the incentive spirometer in an upright position. INSTRUCTIONS FOR USE  1. Sit on the edge of your bed if possible, or sit up as far as you can in bed or on a chair. 2. Hold the incentive spirometer in an upright position. 3. Breathe out normally. 4. Place the mouthpiece in your mouth and seal your lips tightly around it. 5. Breathe in slowly and as deeply as possible, raising the piston or the ball toward the top of the column. 6. Hold your breath for 3-5 seconds or for as long as possible. Allow the piston or ball to fall to the bottom of the column. 7. Remove the mouthpiece from your mouth  and breathe out normally. 8. Rest for a few seconds and repeat Steps 1 through 7 at least 10 times every 1-2 hours when you are awake. Take your time and take a few normal breaths between deep breaths. 9. The spirometer may include an indicator to show your best effort. Use the indicator as a goal to work toward during each repetition. 10. After each set of 10 deep breaths, practice coughing to be sure your lungs are clear. If you have an incision (the cut made at the time of surgery), support your incision when coughing by placing a pillow or rolled up towels firmly against it. Once you are able to get out of bed, walk around indoors and cough well. You may stop using the incentive spirometer when instructed by your caregiver.  RISKS AND COMPLICATIONS  Take your time so you do not get dizzy or light-headed.  If you are in pain, you may need to take or ask for pain medication before doing incentive spirometry. It is harder to take a deep breath if you are having pain. AFTER USE  Rest and breathe slowly and easily.  It can be helpful to keep track of a log of your progress. Your caregiver can provide you with a simple table to help with this. If you are using the spirometer at home, follow these instructions: [place] IF:   You are having difficultly using the spirometer.  You have trouble using the spirometer as often as instructed.  Your pain medication is not giving enough relief while using the spirometer.  You develop fever of 100.5 F (38.1 C) or higher. SEEK IMMEDIATE MEDICAL CARE IF:   You cough up bloody sputum that had not been present before.  You develop fever of 102 F (38.9 C) or greater.  You develop worsening pain at or near the incision site. MAKE SURE YOU:   Understand these instructions.  Will watch your condition.  Will get help right away if you are not doing well or get worse. Document Released: 05/21/2006 Document  Revised: 04/02/2011 Document  Reviewed: 07/22/2006 ExitCare Patient Information 2014 ExitCare, [place].   ________________________________________________________________________  WHAT IS A BLOOD TRANSFUSION? Blood Transfusion Information  A transfusion is the replacement of blood or some of its parts. Blood is made up of multiple cells which provide different functions.  Red blood cells carry oxygen and are used for blood loss replacement.  White blood cells fight against infection.  Platelets control bleeding.  Plasma helps clot blood.  Other blood products are available for specialized needs, such as hemophilia or other clotting disorders. BEFORE THE TRANSFUSION  Who gives blood for transfusions?   Healthy volunteers who are fully evaluated to make sure their blood is safe. This is blood bank blood. Transfusion therapy is the safest it has ever been in the practice of medicine. Before blood is taken from a donor, a complete history is taken to make sure that person has no history of diseases nor engages in risky social behavior (examples are intravenous drug use or sexual activity with multiple partners). The donor's travel history is screened to minimize risk of transmitting infections, such as malaria. The donated blood is tested for signs of infectious diseases, such as HIV and hepatitis. The blood is then tested to be sure it is compatible with you in order to minimize the chance of a transfusion reaction. If you or a relative donates blood, this is often done in anticipation of surgery and is not appropriate for emergency situations. It takes many days to process the donated blood. RISKS AND COMPLICATIONS Although transfusion therapy is very safe and saves many lives, the main dangers of transfusion include:   Getting an infectious disease.  Developing a transfusion reaction. This is an allergic reaction to something in the blood you were given. Every precaution is taken to prevent this. The decision to have a  blood transfusion has been considered carefully by your caregiver before blood is given. Blood is not given unless the benefits outweigh the risks. AFTER THE TRANSFUSION  Right after receiving a blood transfusion, you will usually feel much better and more energetic. This is especially true if your red blood cells have gotten low (anemic). The transfusion raises the level of the red blood cells which carry oxygen, and this usually causes an energy increase.  The nurse administering the transfusion will monitor you carefully for complications. HOME CARE INSTRUCTIONS  No special instructions are needed after a transfusion. You may find your energy is better. Speak with your caregiver about any limitations on activity for underlying diseases you may have. SEEK MEDICAL CARE IF:   Your condition is not improving after your transfusion.  You develop redness or irritation at the intravenous (IV) site. SEEK IMMEDIATE MEDICAL CARE IF:  Any of the following symptoms occur over the next 12 hours:  Shaking chills.  You have a temperature by mouth above 102 F (38.9 C), not controlled by medicine.  Chest, back, or muscle pain.  People around you feel you are not acting correctly or are confused.  Shortness of breath or difficulty breathing.  Dizziness and fainting.  You get a rash or develop hives.  You have a decrease in urine output.  Your urine turns a dark color or changes to pink, red, or brown. Any of the following symptoms occur over the next 10 days:  You have a temperature by mouth above 102 F (38.9 C), not controlled by medicine.  Shortness of breath.  Weakness after normal activity.  The white part of the eye  turns yellow (jaundice).  You have a decrease in the amount of urine or are urinating less often.  Your urine turns a dark color or changes to pink, red, or brown. Document Released: 01/06/2000 Document Revised: 04/02/2011 Document Reviewed: 08/25/2007 [facility]  Patient Information 2014 [place], [place].  _______________________________________________________________________

## 2019-01-27 ENCOUNTER — Other Ambulatory Visit: Payer: Self-pay

## 2019-01-27 ENCOUNTER — Encounter (HOSPITAL_COMMUNITY): Payer: Self-pay

## 2019-01-27 ENCOUNTER — Encounter (HOSPITAL_COMMUNITY)
Admission: RE | Admit: 2019-01-27 | Discharge: 2019-01-27 | Disposition: A | Discharge status: Home / Self Care | Payer: No Typology Code available for payment source | Source: Ambulatory Visit | Attending: Surgery | Admitting: Surgery

## 2019-01-27 DIAGNOSIS — Z6841 Body Mass Index (BMI) 40.0 and over, adult: Secondary | ICD-10-CM | POA: Insufficient documentation

## 2019-01-27 DIAGNOSIS — K589 Irritable bowel syndrome without diarrhea: Secondary | ICD-10-CM | POA: Diagnosis not present

## 2019-01-27 DIAGNOSIS — Z01818 Encounter for other preprocedural examination: Secondary | ICD-10-CM | POA: Insufficient documentation

## 2019-01-27 LAB — CBC WITH DIFFERENTIAL/PLATELET
Abs Immature Granulocytes: 0.02 10*3/uL (ref 0.00–0.07)
Basophils Absolute: 0 10*3/uL (ref 0.0–0.1)
Basophils Relative: 0 %
Eosinophils Absolute: 0 10*3/uL (ref 0.0–0.5)
Eosinophils Relative: 0 %
HCT: 44.3 % (ref 36.0–46.0)
Hemoglobin: 14.4 g/dL (ref 12.0–15.0)
Immature Granulocytes: 0 %
Lymphocytes Relative: 19 %
Lymphs Abs: 1.6 10*3/uL (ref 0.7–4.0)
MCH: 26.7 pg (ref 26.0–34.0)
MCHC: 32.5 g/dL (ref 30.0–36.0)
MCV: 82.2 fL (ref 80.0–100.0)
Monocytes Absolute: 0.5 10*3/uL (ref 0.1–1.0)
Monocytes Relative: 6 %
Neutro Abs: 6.1 10*3/uL (ref 1.7–7.7)
Neutrophils Relative %: 75 %
Platelets: 214 10*3/uL (ref 150–400)
RBC: 5.39 MIL/uL — ABNORMAL HIGH (ref 3.87–5.11)
RDW: 14.3 % (ref 11.5–15.5)
WBC: 8.3 10*3/uL (ref 4.0–10.5)
nRBC: 0 % (ref 0.0–0.2)

## 2019-01-27 LAB — COMPREHENSIVE METABOLIC PANEL
ALT: 37 U/L (ref 0–44)
AST: 23 U/L (ref 15–41)
Albumin: 4 g/dL (ref 3.5–5.0)
Alkaline Phosphatase: 76 U/L (ref 38–126)
Anion gap: 12 (ref 5–15)
BUN: 14 mg/dL (ref 6–20)
CO2: 19 mmol/L — ABNORMAL LOW (ref 22–32)
Calcium: 9.4 mg/dL (ref 8.9–10.3)
Chloride: 105 mmol/L (ref 98–111)
Creatinine, Ser: 0.72 mg/dL (ref 0.44–1.00)
GFR calc Af Amer: >60 mL/min (ref >60)
GFR calc non Af Amer: >60 mL/min (ref >60)
Glucose, Bld: 83 mg/dL (ref 70–99)
Potassium: 3.9 mmol/L (ref 3.5–5.1)
Sodium: 136 mmol/L (ref 135–145)
Total Bilirubin: 0.9 mg/dL (ref 0.3–1.2)
Total Protein: 7.2 g/dL (ref 6.5–8.1)

## 2019-01-27 LAB — ABO/RH: ABO/RH(D): B POS

## 2019-01-27 NOTE — Progress Notes (Signed)
PCP - Dr. Rory Percy  11/2018 Cardiologist - n/a  Chest x-ray - 11/28/2018 EKG - 11/28/2018 Stress Test -n/a  ECHO - n/a Cardiac Cath - n/a  Sleep Study - n/a CPAP - n/a  Fasting Blood Sugar - n/a Checks Blood Sugar ___0__ times a day  Blood Thinner Instructions:n/a Aspirin Instructions:n/a Last Dose:n/a  Anesthesia review:  Chart given to Konrad Felix, PA to review EKG from 11/28/2018 and patient wants to talk to PA also about abnormal EKG.  Patient has a history of HTN during pregnancy and Covid test on 12/22/2018 was positive.   Patient denies shortness of breath, fever, cough and chest pain at PAT appointment   Patient verbalized understanding of instructions that were given to them at the PAT appointment. Patient was also instructed that they will need to review over the PAT instructions again at home before surgery.

## 2019-01-29 ENCOUNTER — Encounter: Payer: Self-pay | Admitting: Cardiovascular Disease

## 2019-01-29 ENCOUNTER — Other Ambulatory Visit (HOSPITAL_COMMUNITY): Payer: No Typology Code available for payment source

## 2019-01-29 ENCOUNTER — Other Ambulatory Visit: Payer: Self-pay

## 2019-01-29 ENCOUNTER — Ambulatory Visit (INDEPENDENT_AMBULATORY_CARE_PROVIDER_SITE_OTHER): Payer: No Typology Code available for payment source | Admitting: Cardiovascular Disease

## 2019-01-29 VITALS — BP 125/88 | HR 84 | Ht 65.0 in | Wt 271.4 lb

## 2019-01-29 DIAGNOSIS — R9431 Abnormal electrocardiogram [ECG] [EKG]: Secondary | ICD-10-CM

## 2019-01-29 NOTE — Progress Notes (Signed)
CARDIOLOGY CONSULT NOTE  Patient ID: Mary Pham MRN: [id number] DOB/AGE: 1985-08-17 34 y.o.  Admit date: (Not on file) Primary Physician: Rory Percy, MD  Reason for Consultation: Abnormal ECG  HPI: Mary Pham is a 34 y.o. female who is being seen today for the evaluation of abnormal ECG at the request of Rory Percy, MD.   I reviewed notes from [facility].  She is being scheduled for bariatric surgery.  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  I reviewed labs performed on 01/27/2019 which showed unremarkable comprehensive metabolic panel and CBC.  She underwent a normal chest x-ray on 11/28/2018.  I personally reviewed the ECG performed on 11/28/2018 which demonstrated sinus rhythm with T wave inversions in leads III, aVF, and V3, with nonspecific T wave abnormalities in leads V4 through V6.  She is scheduled undergo surgery on 02/02/2019.  The patient denies any symptoms of chest pain, palpitations, shortness of breath, lightheadedness, dizziness, leg swelling, orthopnea, PND, and syncope.     No Known Allergies  Current Outpatient Medications  Medication Sig Dispense Refill  . etonogestrel (NEXPLANON) 68 MG IMPL implant Inject 1 each into the skin once.    . Multiple Vitamins-Minerals (BARIATRIC FUSION PO) Take 1 tablet by mouth daily.     No current facility-administered medications for this visit.    Past Medical History:  Diagnosis Date  . Contraceptive education 10/09/2012  . IBS (irritable bowel syndrome)   . Irregular bleeding 10/09/2012  . Migraines    during pregnancy  . Pregnancy induced hypertension    during pregnancy  7 yeasrs ago    Past Surgical History:  Procedure Laterality Date  . CESAREAN SECTION  02/01/2012   Procedure: CESAREAN SECTION;  Surgeon: Guss Bunde, MD;  Location: [place] [facility];  Service: Obstetrics;  Laterality: N/A;  Primary Cesarean Section Delivery Baby Boy @ 0221, Apgars 7/9  . CHOLECYSTECTOMY     . nexplanon implant  12/11/2018   in the office    Social History   Socioeconomic History  . Marital status: Married    Spouse name: Not on file  . Number of children: Not on file  . Years of education: Not on file  . Highest education level: Not on file  Occupational History  . Not on file  Tobacco Use  . Smoking status: Never Smoker  . Smokeless tobacco: Never Used  Substance and Sexual Activity  . Alcohol use: No  . Drug use: No  . Sexual activity: Yes    Birth control/protection: Implant    Comment: Nexplanon placed 12/11/2018  Other Topics Concern  . Not on file  Social History Narrative  . Not on file   Social Determinants of Health   Financial Resource Strain:   . Difficulty of Paying Living Expenses: Not on file  Food Insecurity:   . Worried About Charity fundraiser in the Last Year: Not on file  . Ran Out of Food in the Last Year: Not on file  Transportation Needs:   . Lack of Transportation (Medical): Not on file  . Lack of Transportation (Non-Medical): Not on file  Physical Activity:   . Days of Exercise per Week: Not on file  . Minutes of Exercise per Session: Not on file  Stress:   . Feeling of Stress : Not on file  Social Connections:   . Frequency of Communication with Friends and Family: Not on file  . Frequency of Social  Gatherings with Friends and Family: Not on file  . Attends Religious Services: Not on file  . Active Member of Clubs or Organizations: Not on file  . Attends Archivist Meetings: Not on file  . Marital Status: Not on file  Intimate Partner Violence:   . Fear of Current or Ex-Partner: Not on file  . Emotionally Abused: Not on file  . Physically Abused: Not on file  . Sexually Abused: Not on file     No family history of premature CAD in 1st degree relatives.  Current Meds  Medication Sig  . etonogestrel (NEXPLANON) 68 MG IMPL implant Inject 1 each into the skin once.  . Multiple Vitamins-Minerals (BARIATRIC  FUSION PO) Take 1 tablet by mouth daily.      Review of systems complete and found to be negative unless listed above in HPI    Physical exam Blood pressure 125/88, pulse 84, height 5\' 5"  (1.651 m), weight 271 lb 6.4 oz (123.1 kg), SpO2 100 %. General: NAD Neck: No JVD, no thyromegaly or thyroid nodule.  Lungs: Clear to auscultation bilaterally with normal respiratory effort. CV: Nondisplaced PMI. Regular rate and rhythm, normal S1/S2, no S3/S4, no murmur.  No peripheral edema.  No carotid bruit.    Abdomen: Soft, nontender, no distention.  Skin: Intact without lesions or rashes.  Neurologic: Alert and oriented x 3.  Psych: Normal affect. Extremities: No clubbing or cyanosis.  HEENT: Normal.   ECG: Most recent ECG reviewed.   Labs: Lab Results  Component Value Date/Time   K 3.9 01/27/2019 10:23 AM   BUN 14 01/27/2019 10:23 AM   CREATININE 0.72 01/27/2019 10:23 AM   CREATININE 0.83 10/09/2012 02:53 PM   ALT 37 01/27/2019 10:23 AM   TSH 1.808 10/09/2012 02:53 PM   HGB 14.4 01/27/2019 10:23 AM     Lipids: No results found for: LDLCALC, LDLDIRECT, CHOL, TRIG, HDL      ASSESSMENT AND PLAN:  1.  Abnormal EKG: The T wave inversion seen inferiorly and V3 and nonspecific T wave abnormalities are nonspecific findings.  I suspect lead placement may also have something to do with this given her body habitus. She has a very low pretest probability for ischemic heart disease and lacks any traditional risk factors.  She denies anginal symptoms altogether.  I do not feel any cardiac testing is indicated at this time.  She can proceed with surgery as planned.     Disposition: Follow up as needed  Signed: Kate Sable, M.D., F.A.C.C.  01/29/2019, 2:51 PM

## 2019-01-29 NOTE — Patient Instructions (Signed)
Medication Instructions:  Continue all current medications.  Labwork: none  Testing/Procedures: none  Follow-Up: As needed.    Any Other Special Instructions Will Be Listed Below (If Applicable).  If you need a refill on your cardiac medications before your next appointment, please call your pharmacy.  

## 2019-01-30 ENCOUNTER — Other Ambulatory Visit: Payer: Self-pay | Admitting: *Deleted

## 2019-01-30 NOTE — Patient Outreach (Signed)
[facility]) Care Management  01/30/2019  Mary Pham 1985/10/09 [id number]   Preoperative Screening Call Referral received: 01/30/19 Surgery/procedure date: 02/02/19 Insurance: [employer] plan   Subjective:  Initial successful telephone call to patient's preferred number in order to complete preoperative screening. 2 HIPAA identifiers verified. Discussed purpose of preoperative call. Patient voices understanding and agrees to call.  Mary Pham states sheunderstands the reason for the surgery and the expected time of arrival of 730 on 02/02/19.  She has  completed  preoperative testing on 01/27/19 and has no additional questions. She  expects to be in the hospital 1 day.  She states she has completed medical leave paperwork and does not have the hospital indemnity benefit . SHe says she will have 24/7 care at home provided by husband   to assist in her recovery.  She agrees to a post hospital discharge transition of care call.    Objective:  Mary Pham is scheduled  at [facility] for Laparoscopic gastric bypass on 02/02/19.  she completed pre-op testing on 01/27/19  Assessment: Preoperative call completed, no preoperative needs identified.   Plan: RNCM will call patient for transition of care outreach within 72 hours of hospital discharge notification.  Joylene Draft, RN, [place] Management Coordinator  [phone]- Mobile [phone]- Toll Free Main Office

## 2019-01-30 NOTE — Progress Notes (Signed)
Anesthesia Chart Review   Case: [id number] Date/Time: 02/02/19 0917   Procedure: LAPAROSCOPIC ROUX-EN-Y GASTRIC BYPASS WITH UPPER ENDOSCOPY, Upper Endo-ERAS Pathway (N/A )   Anesthesia type: General   Pre-op diagnosis: Morbid Obesity   Location: [place] 02 / [facility]   Surgeons: Johnathan Hausen, MD      DISCUSSION:34 y.o. never smoker with h/o IBS, migraines, mobid obesity scheduled for above procedure 02/02/19 with Dr. Johnathan Hausen.   Pt seen by cardiologist for pre-operative evaluation 01/29/2019.  Per OV note, "Abnormal EKG: The T wave inversion seen inferiorly and V3 and nonspecific T wave abnormalities are nonspecific findings.  I suspect lead placement may also have something to do with this given her body habitus. She has a very low pretest probability for ischemic heart disease and lacks any traditional risk factors.  She denies anginal symptoms altogether.  I do not feel any cardiac testing is indicated at this time.  She can proceed with surgery as planned."  Anticipate pt can proceed with planned procedure barring acute status change.   VS: BP (!) 145/87 (BP Location: Left Arm)   Pulse 84   Temp 36.9 C (Oral)   Resp 17   Ht 5\' 5"  (1.651 m)   Wt 123.5 kg   SpO2 99%   BMI 45.30 kg/m   PROVIDERS: Rory Percy, MD is PCP   Kate Sable, MD is Cardiologist  LABS: Labs reviewed: Acceptable for surgery. (all labs ordered are listed, but only abnormal results are displayed)  Labs Reviewed  CBC WITH DIFFERENTIAL/PLATELET - Abnormal; Notable for the following components:      Result Value   RBC 5.39 (*)    All other components within normal limits  COMPREHENSIVE METABOLIC PANEL - Abnormal; Notable for the following components:   CO2 19 (*)    All other components within normal limits  TYPE AND SCREEN  ABO/RH     IMAGES:   EKG: 11/28/2018 Rate 89 bpm Normal sinus rhythm  ST & T wave abnormality, consider inferior ischemia  CV:  Past Medical History:   Diagnosis Date  . Contraceptive education 10/09/2012  . IBS (irritable bowel syndrome)   . Irregular bleeding 10/09/2012  . Migraines    during pregnancy  . Pregnancy induced hypertension    during pregnancy  7 yeasrs ago    Past Surgical History:  Procedure Laterality Date  . CESAREAN SECTION  02/01/2012   Procedure: CESAREAN SECTION;  Surgeon: Guss Bunde, MD;  Location: [place] [facility];  Service: Obstetrics;  Laterality: N/A;  Primary Cesarean Section Delivery Baby Boy @ 0221, Apgars 7/9  . CHOLECYSTECTOMY    . nexplanon implant  12/11/2018   in the office    MEDICATIONS: . etonogestrel (NEXPLANON) 22 MG IMPL implant  . Multiple Vitamins-Minerals (BARIATRIC FUSION PO)   No current facility-administered medications for this encounter.    Maia Plan [facility] Pre-Surgical Testing [phone] 01/30/19  9:24 AM

## 2019-02-01 NOTE — H&P (Signed)
Mary Pham Location: [place] Office Patient #: [id number] DOB: Apr 19, 1985 Married / Language: English / Race: White Female History of Present Illness  The patient is a 34 year old female who presents for a bariatric surgery evaluation. Preop visit for lap roux en Y gastric bypass Questions answered. She has an abnormal EKG which showed ST and T wave changes possibly suggesting inferior ischemia. She is asymptomatic.   I discussed the procedure with her and her husband in detail including port placement. She is ready to proceed with lap roux en Y gastric bypass.    Allergies  No Known Drug Allergies [10/08/2018]:  Medication History  Nexplanon (68MG  Implant, Subcutaneous) Active. Medications Reconciled  Vitals (Mary Pham CMA; 01/07/2019 4:01 PM) 01/07/2019 4:00 PM Weight: 283 lb Height: 65in Body Surface Area: 2.29 m Body Mass Index: 47.09 kg/m  Pulse: 100 (Regular)  BP: 124/82 (Sitting, Left Arm, Standard)       Physical Exam (Mary Oxendine B. Hassell Done MD; 01/07/2019 5:05 PM) General  BMI 47 Note: Obese WF NAD HEENT unremarkable Neck supple Chest clear Heart SR wtihout murmurs or gallops Abdomen obese and nontender Ext FROM with no hx DVT     Assessment & Plan  MORBID OBESITY, UNSPECIFIED OBESITY TYPE (E66.01)  Plan :  Roux en Y gastric bypass

## 2019-02-02 ENCOUNTER — Other Ambulatory Visit: Payer: Self-pay

## 2019-02-02 ENCOUNTER — Encounter (HOSPITAL_COMMUNITY)
Admission: RE | Disposition: A | Discharge status: Home / Self Care | Payer: Self-pay | Source: Other Acute Inpatient Hospital | Attending: Surgery

## 2019-02-02 ENCOUNTER — Inpatient Hospital Stay (HOSPITAL_COMMUNITY): Payer: No Typology Code available for payment source | Admitting: Anesthesiology

## 2019-02-02 ENCOUNTER — Inpatient Hospital Stay (HOSPITAL_COMMUNITY): Payer: No Typology Code available for payment source | Admitting: Physician Assistant

## 2019-02-02 ENCOUNTER — Encounter (HOSPITAL_COMMUNITY): Payer: Self-pay | Admitting: Surgery

## 2019-02-02 ENCOUNTER — Other Ambulatory Visit: Payer: Self-pay | Admitting: *Deleted

## 2019-02-02 ENCOUNTER — Inpatient Hospital Stay (HOSPITAL_COMMUNITY)
Admission: RE | Admit: 2019-02-02 | Discharge: 2019-02-03 | DRG: 621 | Disposition: A | Discharge status: Home / Self Care | Payer: No Typology Code available for payment source | Source: Other Acute Inpatient Hospital | Attending: Surgery | Admitting: Surgery

## 2019-02-02 DIAGNOSIS — K449 Diaphragmatic hernia without obstruction or gangrene: Secondary | ICD-10-CM | POA: Diagnosis present

## 2019-02-02 DIAGNOSIS — K589 Irritable bowel syndrome without diarrhea: Secondary | ICD-10-CM | POA: Diagnosis present

## 2019-02-02 DIAGNOSIS — Z20822 Contact with and (suspected) exposure to covid-19: Secondary | ICD-10-CM | POA: Diagnosis present

## 2019-02-02 DIAGNOSIS — Z6841 Body Mass Index (BMI) 40.0 and over, adult: Secondary | ICD-10-CM

## 2019-02-02 DIAGNOSIS — K219 Gastro-esophageal reflux disease without esophagitis: Secondary | ICD-10-CM | POA: Diagnosis present

## 2019-02-02 DIAGNOSIS — I10 Essential (primary) hypertension: Secondary | ICD-10-CM | POA: Diagnosis present

## 2019-02-02 DIAGNOSIS — Z9884 Bariatric surgery status: Secondary | ICD-10-CM

## 2019-02-02 HISTORY — PX: GASTRIC ROUX-EN-Y: SHX5262

## 2019-02-02 LAB — CREATININE, SERUM
Creatinine, Ser: 0.9 mg/dL (ref 0.44–1.00)
GFR calc Af Amer: >60 mL/min (ref >60)
GFR calc non Af Amer: >60 mL/min (ref >60)

## 2019-02-02 LAB — TYPE AND SCREEN
ABO/RH(D): B POS
Antibody Screen: NEGATIVE

## 2019-02-02 LAB — CBC
HCT: 46.9 % — ABNORMAL HIGH (ref 36.0–46.0)
Hemoglobin: 14.5 g/dL (ref 12.0–15.0)
MCH: 26.7 pg (ref 26.0–34.0)
MCHC: 30.9 g/dL (ref 30.0–36.0)
MCV: 86.4 fL (ref 80.0–100.0)
Platelets: 187 10*3/uL (ref 150–400)
RBC: 5.43 MIL/uL — ABNORMAL HIGH (ref 3.87–5.11)
RDW: 14.7 % (ref 11.5–15.5)
WBC: 12.3 10*3/uL — ABNORMAL HIGH (ref 4.0–10.5)
nRBC: 0 % (ref 0.0–0.2)

## 2019-02-02 LAB — PREGNANCY, URINE: Preg Test, Ur: NEGATIVE

## 2019-02-02 SURGERY — LAPAROSCOPIC ROUX-EN-Y GASTRIC BYPASS WITH UPPER ENDOSCOPY
Anesthesia: General | Site: Abdomen

## 2019-02-02 MED ORDER — APREPITANT 40 MG PO CAPS
40.0000 mg | ORAL_CAPSULE | ORAL | Status: AC
  Administered 2019-02-02: 40 mg via ORAL
  Filled 2019-02-02: qty 1

## 2019-02-02 MED ORDER — CHLORHEXIDINE GLUCONATE CLOTH 2 % EX PADS: 6.0000 | MEDICATED_PAD | Freq: Once | CUTANEOUS | Status: DC

## 2019-02-02 MED ORDER — FENTANYL CITRATE (PF) 100 MCG/2ML IJ SOLN
INTRAMUSCULAR | Status: AC
  Filled 2019-02-02: qty 2

## 2019-02-02 MED ORDER — BUPIVACAINE HCL (PF) 0.25 % IJ SOLN
INTRAMUSCULAR | Status: DC | PRN
  Administered 2019-02-02: 30 mL

## 2019-02-02 MED ORDER — FENTANYL CITRATE (PF) 100 MCG/2ML IJ SOLN
25.0000 ug | INTRAMUSCULAR | Status: DC | PRN
  Administered 2019-02-02 (×2): 50 ug via INTRAVENOUS

## 2019-02-02 MED ORDER — SCOPOLAMINE 1 MG/3DAYS TD PT72: 1.0000 | MEDICATED_PATCH | TRANSDERMAL | Status: DC

## 2019-02-02 MED ORDER — ACETAMINOPHEN 500 MG PO TABS
1000.0000 mg | ORAL_TABLET | ORAL | Status: AC
  Administered 2019-02-02: 1000 mg via ORAL
  Filled 2019-02-02: qty 2

## 2019-02-02 MED ORDER — ROCURONIUM BROMIDE 10 MG/ML (PF) SYRINGE
PREFILLED_SYRINGE | INTRAVENOUS | Status: AC
  Filled 2019-02-02: qty 10

## 2019-02-02 MED ORDER — ROCURONIUM BROMIDE 10 MG/ML (PF) SYRINGE
PREFILLED_SYRINGE | INTRAVENOUS | Status: DC | PRN
  Administered 2019-02-02: 20 mg via INTRAVENOUS
  Administered 2019-02-02: 70 mg via INTRAVENOUS
  Administered 2019-02-02: 10 mg via INTRAVENOUS
  Administered 2019-02-02 (×2): 15 mg via INTRAVENOUS
  Administered 2019-02-02: 20 mg via INTRAVENOUS
  Administered 2019-02-02: 10 mg via INTRAVENOUS

## 2019-02-02 MED ORDER — ONDANSETRON HCL 4 MG/2ML IJ SOLN
INTRAMUSCULAR | Status: DC | PRN
  Administered 2019-02-02 (×2): 4 mg via INTRAVENOUS

## 2019-02-02 MED ORDER — ONDANSETRON HCL 4 MG/2ML IJ SOLN
INTRAMUSCULAR | Status: AC
  Filled 2019-02-02: qty 6

## 2019-02-02 MED ORDER — LACTATED RINGERS IR SOLN
Status: DC | PRN
  Administered 2019-02-02: 1000 mL

## 2019-02-02 MED ORDER — LIDOCAINE 2% (20 MG/ML) 5 ML SYRINGE
INTRAMUSCULAR | Status: DC | PRN
  Administered 2019-02-02: 1.5 mg/kg/h via INTRAVENOUS
  Administered 2019-02-02: 80 mg via INTRAVENOUS

## 2019-02-02 MED ORDER — KCL IN DEXTROSE-NACL 20-5-0.45 MEQ/L-%-% IV SOLN
INTRAVENOUS | Status: DC
  Filled 2019-02-02 (×2): qty 1000

## 2019-02-02 MED ORDER — KETAMINE HCL 10 MG/ML IJ SOLN
INTRAMUSCULAR | Status: DC | PRN
  Administered 2019-02-02: 20 mg via INTRAVENOUS
  Administered 2019-02-02: 10 mg via INTRAVENOUS

## 2019-02-02 MED ORDER — PANTOPRAZOLE SODIUM 40 MG IV SOLR
40.0000 mg | Freq: Every day | INTRAVENOUS | Status: DC
  Administered 2019-02-02: 40 mg via INTRAVENOUS
  Filled 2019-02-02: qty 40

## 2019-02-02 MED ORDER — GLYCOPYRROLATE PF 0.2 MG/ML IJ SOSY
PREFILLED_SYRINGE | INTRAMUSCULAR | Status: AC
  Filled 2019-02-02: qty 4

## 2019-02-02 MED ORDER — ENSURE MAX PROTEIN PO LIQD
2.0000 [oz_av] | ORAL | Status: DC
  Administered 2019-02-03 (×2): 2 [oz_av] via ORAL

## 2019-02-02 MED ORDER — SODIUM CHLORIDE 0.9 % IV SOLN
2.0000 g | INTRAVENOUS | Status: AC
  Administered 2019-02-02: 2 g via INTRAVENOUS
  Filled 2019-02-02: qty 2

## 2019-02-02 MED ORDER — TISSEEL VH 10 ML EX KIT
PACK | CUTANEOUS | Status: AC
  Filled 2019-02-02: qty 2

## 2019-02-02 MED ORDER — ACETAMINOPHEN 500 MG PO TABS
1000.0000 mg | ORAL_TABLET | Freq: Three times a day (TID) | ORAL | Status: DC
  Administered 2019-02-02 – 2019-02-03 (×2): 1000 mg via ORAL
  Filled 2019-02-02 (×2): qty 2

## 2019-02-02 MED ORDER — HEPARIN SODIUM (PORCINE) 5000 UNIT/ML IJ SOLN
5000.0000 [IU] | INTRAMUSCULAR | Status: AC
  Administered 2019-02-02: 5000 [IU] via SUBCUTANEOUS
  Filled 2019-02-02: qty 1

## 2019-02-02 MED ORDER — BUPIVACAINE LIPOSOME 1.3 % IJ SUSP
20.0000 mL | Freq: Once | INTRAMUSCULAR | Status: AC
  Administered 2019-02-02: 20 mL
  Filled 2019-02-02: qty 20

## 2019-02-02 MED ORDER — SCOPOLAMINE 1 MG/3DAYS TD PT72
1.0000 | MEDICATED_PATCH | TRANSDERMAL | Status: DC
  Administered 2019-02-02: 1.5 mg via TRANSDERMAL
  Filled 2019-02-02: qty 1

## 2019-02-02 MED ORDER — LACTATED RINGERS IV SOLN: INTRAVENOUS | Status: DC

## 2019-02-02 MED ORDER — MORPHINE SULFATE (PF) 2 MG/ML IV SOLN
1.0000 mg | INTRAVENOUS | Status: DC | PRN
  Administered 2019-02-02 – 2019-02-03 (×2): 2 mg via INTRAVENOUS
  Filled 2019-02-02: qty 1
  Filled 2019-02-02: qty 2

## 2019-02-02 MED ORDER — GLYCOPYRROLATE PF 0.2 MG/ML IJ SOSY
PREFILLED_SYRINGE | INTRAMUSCULAR | Status: AC
  Filled 2019-02-02: qty 1

## 2019-02-02 MED ORDER — ESMOLOL HCL 100 MG/10ML IV SOLN
INTRAVENOUS | Status: DC | PRN
  Administered 2019-02-02 (×2): 10 mg via INTRAVENOUS
  Administered 2019-02-02 (×2): 5 mg via INTRAVENOUS

## 2019-02-02 MED ORDER — GLYCOPYRROLATE PF 0.2 MG/ML IJ SOSY
PREFILLED_SYRINGE | INTRAMUSCULAR | Status: DC | PRN
  Administered 2019-02-02: .2 mg via INTRAVENOUS

## 2019-02-02 MED ORDER — ESMOLOL HCL 100 MG/10ML IV SOLN
INTRAVENOUS | Status: AC
  Filled 2019-02-02: qty 10

## 2019-02-02 MED ORDER — GABAPENTIN 300 MG PO CAPS
300.0000 mg | ORAL_CAPSULE | ORAL | Status: AC
  Administered 2019-02-02: 300 mg via ORAL
  Filled 2019-02-02: qty 1

## 2019-02-02 MED ORDER — OXYCODONE HCL 5 MG/5ML PO SOLN: 5.0000 mg | Freq: Four times a day (QID) | ORAL | Status: DC | PRN

## 2019-02-02 MED ORDER — TISSEEL VH 10 ML EX KIT
PACK | CUTANEOUS | Status: DC | PRN
  Administered 2019-02-02: 10 mL

## 2019-02-02 MED ORDER — TRAMADOL HCL 50 MG PO TABS
50.0000 mg | ORAL_TABLET | Freq: Four times a day (QID) | ORAL | Status: DC | PRN
  Administered 2019-02-03: 50 mg via ORAL
  Filled 2019-02-02 (×2): qty 1

## 2019-02-02 MED ORDER — DEXAMETHASONE SODIUM PHOSPHATE 10 MG/ML IJ SOLN
INTRAMUSCULAR | Status: AC
  Filled 2019-02-02: qty 3

## 2019-02-02 MED ORDER — ONDANSETRON HCL 4 MG/2ML IJ SOLN: 4.0000 mg | Freq: Once | INTRAMUSCULAR | Status: DC | PRN

## 2019-02-02 MED ORDER — LIDOCAINE 2% (20 MG/ML) 5 ML SYRINGE
INTRAMUSCULAR | Status: AC
  Filled 2019-02-02: qty 15

## 2019-02-02 MED ORDER — BUPIVACAINE HCL (PF) 0.25 % IJ SOLN
INTRAMUSCULAR | Status: AC
  Filled 2019-02-02: qty 30

## 2019-02-02 MED ORDER — ONDANSETRON HCL 4 MG/2ML IJ SOLN: 4.0000 mg | INTRAMUSCULAR | Status: DC | PRN

## 2019-02-02 MED ORDER — EPHEDRINE SULFATE-NACL 50-0.9 MG/10ML-% IV SOSY
PREFILLED_SYRINGE | INTRAVENOUS | Status: DC | PRN
  Administered 2019-02-02: 10 mg via INTRAVENOUS
  Administered 2019-02-02: 5 mg via INTRAVENOUS

## 2019-02-02 MED ORDER — 0.9 % SODIUM CHLORIDE (POUR BTL) OPTIME
TOPICAL | Status: DC | PRN
  Administered 2019-02-02: 1000 mL

## 2019-02-02 MED ORDER — LIDOCAINE HCL 2 % IJ SOLN
INTRAMUSCULAR | Status: AC
  Filled 2019-02-02: qty 40

## 2019-02-02 MED ORDER — DEXAMETHASONE SODIUM PHOSPHATE 10 MG/ML IJ SOLN
INTRAMUSCULAR | Status: DC | PRN
  Administered 2019-02-02: 10 mg via INTRAVENOUS

## 2019-02-02 MED ORDER — ACETAMINOPHEN 160 MG/5ML PO SOLN: 1000.0000 mg | Freq: Three times a day (TID) | ORAL | Status: DC

## 2019-02-02 MED ORDER — PROPOFOL 10 MG/ML IV BOLUS
INTRAVENOUS | Status: DC | PRN
  Administered 2019-02-02: 200 mg via INTRAVENOUS

## 2019-02-02 MED ORDER — MEPERIDINE HCL 50 MG/ML IJ SOLN: 6.2500 mg | INTRAMUSCULAR | Status: DC | PRN

## 2019-02-02 MED ORDER — FENTANYL CITRATE (PF) 100 MCG/2ML IJ SOLN
INTRAMUSCULAR | Status: DC | PRN
  Administered 2019-02-02 (×2): 50 ug via INTRAVENOUS
  Administered 2019-02-02: 100 ug via INTRAVENOUS

## 2019-02-02 MED ORDER — HEPARIN SODIUM (PORCINE) 5000 UNIT/ML IJ SOLN
5000.0000 [IU] | Freq: Three times a day (TID) | INTRAMUSCULAR | Status: DC
  Administered 2019-02-02 – 2019-02-03 (×2): 5000 [IU] via SUBCUTANEOUS
  Filled 2019-02-02 (×2): qty 1

## 2019-02-02 MED ORDER — ROCURONIUM BROMIDE 10 MG/ML (PF) SYRINGE
PREFILLED_SYRINGE | INTRAVENOUS | Status: AC
  Filled 2019-02-02: qty 30

## 2019-02-02 MED ORDER — SUGAMMADEX SODIUM 500 MG/5ML IV SOLN
INTRAVENOUS | Status: AC
  Filled 2019-02-02: qty 5

## 2019-02-02 MED ORDER — MIDAZOLAM HCL 5 MG/5ML IJ SOLN
INTRAMUSCULAR | Status: DC | PRN
  Administered 2019-02-02: 2 mg via INTRAVENOUS

## 2019-02-02 MED ORDER — MIDAZOLAM HCL 2 MG/2ML IJ SOLN
INTRAMUSCULAR | Status: AC
  Filled 2019-02-02: qty 2

## 2019-02-02 MED ORDER — EPHEDRINE 5 MG/ML INJ
INTRAVENOUS | Status: AC
  Filled 2019-02-02: qty 10

## 2019-02-02 MED ORDER — SUGAMMADEX SODIUM 500 MG/5ML IV SOLN
INTRAVENOUS | Status: DC | PRN
  Administered 2019-02-02: 500 mg via INTRAVENOUS

## 2019-02-02 SURGICAL SUPPLY — 67 items
APPLICATOR COTTON TIP 6 STRL (MISCELLANEOUS) ×2 IMPLANT
APPLICATOR COTTON TIP 6IN STRL (MISCELLANEOUS) ×4
APPLIER CLIP ROT 10 11.4 M/L (STAPLE)
APPLIER CLIP ROT 13.4 12 LRG (CLIP)
BLADE SURG 15 STRL LF DISP TIS (BLADE) ×1 IMPLANT
BLADE SURG 15 STRL SS (BLADE) ×1
CABLE HIGH FREQUENCY MONO STRZ (ELECTRODE) IMPLANT
CLIP APPLIE ROT 10 11.4 M/L (STAPLE) IMPLANT
CLIP APPLIE ROT 13.4 12 LRG (CLIP) IMPLANT
CLIP SUT LAPRA TY ABSORB (SUTURE) ×4 IMPLANT
COVER WAND RF STERILE (DRAPES) IMPLANT
DERMABOND ADVANCED (GAUZE/BANDAGES/DRESSINGS) ×1
DERMABOND ADVANCED .7 DNX12 (GAUZE/BANDAGES/DRESSINGS) ×1 IMPLANT
DEVICE SUT QUICK LOAD TK 5 (STAPLE) ×2 IMPLANT
DEVICE SUT TI-KNOT TK 5X26 (MISCELLANEOUS) ×2 IMPLANT
DEVICE SUTURE ENDOST 10MM (ENDOMECHANICALS) ×2 IMPLANT
DISSECTOR BLUNT TIP ENDO 5MM (MISCELLANEOUS) ×2 IMPLANT
DRAIN PENROSE 18X1/4 LTX STRL (WOUND CARE) ×2 IMPLANT
GAUZE 4X4 16PLY RFD (DISPOSABLE) ×2 IMPLANT
GLOVE BIOGEL M 8.0 STRL (GLOVE) ×2 IMPLANT
GOWN STRL REUS W/TWL XL LVL3 (GOWN DISPOSABLE) ×4 IMPLANT
HANDLE STAPLE EGIA 4 XL (STAPLE) ×2 IMPLANT
HOVERMATT SINGLE USE (MISCELLANEOUS) ×2 IMPLANT
KIT BASIN OR (CUSTOM PROCEDURE TRAY) ×2 IMPLANT
KIT GASTRIC LAVAGE 34FR ADT (SET/KITS/TRAYS/PACK) ×2 IMPLANT
KIT TURNOVER KIT A (KITS) ×2 IMPLANT
MARKER SKIN DUAL TIP RULER LAB (MISCELLANEOUS) ×2 IMPLANT
NEEDLE SPNL 22GX3.5 QUINCKE BK (NEEDLE) ×2 IMPLANT
PACK CARDIOVASCULAR III (CUSTOM PROCEDURE TRAY) ×2 IMPLANT
PENCIL SMOKE EVACUATOR (MISCELLANEOUS) IMPLANT
RELOAD EGIA 45 MED/THCK PURPLE (STAPLE) ×2 IMPLANT
RELOAD EGIA 45 TAN VASC (STAPLE) ×2 IMPLANT
RELOAD EGIA 60 MED/THCK PURPLE (STAPLE) ×2 IMPLANT
RELOAD EGIA 60 TAN VASC (STAPLE) ×2 IMPLANT
RELOAD ENDO STITCH 2.0 (ENDOMECHANICALS) ×10
RELOAD TRI 45 ART MED THCK PUR (STAPLE) ×2 IMPLANT
RELOAD TRI 60 ART MED THCK PUR (STAPLE) ×4 IMPLANT
SCISSORS LAP 5X45 EPIX DISP (ENDOMECHANICALS) ×2 IMPLANT
SEALANT SURGICAL APPL DUAL CAN (MISCELLANEOUS) ×2 IMPLANT
SET IRRIG TUBING LAPAROSCOPIC (IRRIGATION / IRRIGATOR) ×2 IMPLANT
SET TUBE SMOKE EVAC HIGH FLOW (TUBING) ×2 IMPLANT
SHEARS HARMONIC ACE PLUS 45CM (MISCELLANEOUS) ×2 IMPLANT
SLEEVE ADV FIXATION 12X100MM (TROCAR) ×4 IMPLANT
SLEEVE ADV FIXATION 5X100MM (TROCAR) IMPLANT
SOL ANTI FOG 6CC (MISCELLANEOUS) ×1 IMPLANT
SOLUTION ANTI FOG 6CC (MISCELLANEOUS) ×1
STAPLER VISISTAT 35W (STAPLE) IMPLANT
SUT MNCRL AB 4-0 PS2 18 (SUTURE) ×6 IMPLANT
SUT RELOAD ENDO STITCH 2 48X1 (ENDOMECHANICALS) ×5
SUT RELOAD ENDO STITCH 2.0 (ENDOMECHANICALS) ×5
SUT SURGIDAC NAB ES-9 0 48 120 (SUTURE) ×4 IMPLANT
SUT VIC AB 2-0 SH 27 (SUTURE) ×1
SUT VIC AB 2-0 SH 27X BRD (SUTURE) ×1 IMPLANT
SUTURE RELOAD END STTCH 2 48X1 (ENDOMECHANICALS) ×5 IMPLANT
SUTURE RELOAD ENDO STITCH 2.0 (ENDOMECHANICALS) ×5 IMPLANT
SYR 10ML ECCENTRIC (SYRINGE) ×2 IMPLANT
SYR 20ML LL LF (SYRINGE) ×4 IMPLANT
SYR 50ML LL SCALE MARK (SYRINGE) ×2 IMPLANT
TOWEL OR 17X26 10 PK STRL BLUE (TOWEL DISPOSABLE) ×2 IMPLANT
TOWEL OR NON WOVEN STRL DISP B (DISPOSABLE) ×2 IMPLANT
TRAY FOLEY MTR SLVR 14FR STAT (SET/KITS/TRAYS/PACK) ×2 IMPLANT
TROCAR 12M 150ML BLUNT (TROCAR) ×2 IMPLANT
TROCAR ADV FIXATION 12X100MM (TROCAR) ×2 IMPLANT
TROCAR ADV FIXATION 5X100MM (TROCAR) ×2 IMPLANT
TROCAR BLADELESS OPT 5 100 (ENDOMECHANICALS) ×2 IMPLANT
TROCAR XCEL 12X100 BLDLESS (ENDOMECHANICALS) ×2 IMPLANT
TUBING CONNECTING 10 (TUBING) ×4 IMPLANT

## 2019-02-02 NOTE — Transfer of Care (Signed)
Immediate Anesthesia Transfer of Care Note  Patient: Mary Pham  Procedure(s) Performed: Procedure(s): LAPAROSCOPIC ROUX-EN-Y GASTRIC BYPASS WITH UPPER ENDOSCOPY, Upper Endo-ERAS Pathway (N/A)  Patient Location: PACU  Anesthesia Type:General  Level of Consciousness:  sedated, patient cooperative and responds to stimulation  Airway & Oxygen Therapy:Patient Spontanous Breathing and Patient connected to face mask oxgen  Post-op Assessment:  Report given to PACU RN and Post -op Vital signs reviewed and stable  Post vital signs:  Reviewed and stable  Last Vitals:  Vitals:   02/02/19 0751 02/02/19 0824  BP:  124/78  Pulse:  95  Resp: 14 16  Temp: 36.5 C   SpO2: [id number]    Complications: No apparent anesthesia complications

## 2019-02-02 NOTE — Discharge Instructions (Signed)
° ° ° °GASTRIC BYPASS/SLEEVE ° Home Care Instructions ° ° These instructions are to help you care for yourself when you go home. ° °Call: If you have any problems. °• Call [phone] and ask for the surgeon on call °• If you need immediate help, come to the ER at [facility].  °• Tell the ER staff that you are a new post-op gastric bypass or gastric sleeve patient °  °Signs and symptoms to report: • Severe vomiting or nausea °o If you cannot keep down clear liquids for longer than 1 day, call your surgeon  °• Abdominal pain that does not get better after taking your pain medication °• Fever over 100.4° F with chills °• Heart beating over 100 beats a minute °• Shortness of breath at rest °• Chest pain °•  Redness, swelling, drainage, or foul odor at incision (surgical) sites °•  If your incisions open or pull apart °• Swelling or pain in calf (lower leg) °• Diarrhea (Loose bowel movements that happen often), frequent watery, uncontrolled bowel movements °• Constipation, (no bowel movements for 3 days) if this happens: Pick one °o Milk of Magnesia, 2 tablespoons by mouth, 3 times a day for 2 days if needed °o Stop taking Milk of Magnesia once you have a bowel movement °o Call your doctor if constipation continues °Or °o Miralax  (instead of Milk of Magnesia) following the label instructions °o Stop taking Miralax once you have a bowel movement °o Call your doctor if constipation continues °• Anything you think is not normal °  °Normal side effects after surgery: • Unable to sleep at night or unable to focus °• Irritability or moody °• Being tearful (crying) or depressed °These are common complaints, possibly related to your anesthesia medications that put you to sleep, stress of surgery, and change in lifestyle.  This usually goes away a few weeks after surgery.  If these feelings continue, call your primary care doctor. °  °Wound Care: You may have surgical glue, steri-strips, or staples over your incisions after  surgery °• Surgical glue:  Looks like a clear film over your incisions and will wear off a little at a time °• Steri-strips: Strips of tape over your incisions. You may notice a yellowish color on the skin under the steri-strips. This is used to make the   steri-strips stick better. Do not pull the steri-strips off - let them fall off °• Staples: Staples may be removed before you leave the hospital °o If you go home with staples, call [facility], [phone] at for an appointment with your surgeon’s nurse to have staples removed 10 days after surgery. °• Showering: You may shower two (2) days after your surgery unless your surgeon tells you differently °o Wash gently around incisions with warm soapy water, rinse well, and gently pat dry  °o No tub baths until staples are removed, steri-strips fall off or glue is gone.  °  °Medications: • Medications should be liquid or crushed if larger than the size of a dime °• Extended release pills (medication that release a little bit at a time through the day) should NOT be crushed or cut. (examples include XL, ER, DR, SR) °• Depending on the size and number of medications you take, you may need to space (take a few throughout the day)/change the time you take your medications so that you do not over-fill your pouch (smaller stomach) °• Make sure you follow-up with your primary care doctor to   make medication changes needed during rapid weight loss and life-style changes °• If you have diabetes, follow up with the doctor that orders your diabetes medication(s) within one week after surgery and check your blood sugar regularly. °• Do not drive while taking prescription pain medication  °• It is ok to take Tylenol by the bottle instructions with your pain medicine or instead of your pain medicine as needed.  DO NOT TAKE NSAIDS (EXAMPLES OF NSAIDS:  IBUPROFREN/ NAPROXEN)  °Diet:                    First 2 Weeks ° You will see the dietician t about two (2) weeks  after your surgery. The dietician will increase the types of foods you can eat if you are handling liquids well: °• If you have severe vomiting or nausea and cannot keep down clear liquids lasting longer than 1 day, call your surgeon @ ([phone]) °Protein Shake °• Drink at least 2 ounces of shake 5-6 times per day °• Each serving of protein shakes (usually 8 - 12 ounces) should have: °o 15 grams of protein  °o And no more than 5 grams of carbohydrate  °• Goal for protein each day: °o Men = 80 grams per day °o Women = 60 grams per day °• Protein powder may be added to fluids such as non-fat milk or Lactaid milk or unsweetened Soy/Almond milk (limit to 35 grams added protein powder per serving) ° °Hydration °• Slowly increase the amount of water and other clear liquids as tolerated (See Acceptable Fluids) °• Slowly increase the amount of protein shake as tolerated  °•  Sip fluids slowly and throughout the day.  Do not use straws. °• May use sugar substitutes in small amounts (no more than 6 - 8 packets per day; i.e. Splenda) ° °Fluid Goal °• The first goal is to drink at least 8 ounces of protein shake/drink per day (or as directed by the nutritionist); some examples of protein shakes are Syntrax Nectar, Adkins Advantage, EAS Edge HP, and Unjury. See handout from pre-op Bariatric Education Class: °o Slowly increase the amount of protein shake you drink as tolerated °o You may find it easier to slowly sip shakes throughout the day °o It is important to get your proteins in first °• Your fluid goal is to drink 64 - 100 ounces of fluid daily °o It may take a few weeks to build up to this °• 32 oz (or more) should be clear liquids  °And  °• 32 oz (or more) should be full liquids (see below for examples) °• Liquids should not contain sugar, caffeine, or carbonation ° °Clear Liquids: °• Water or Sugar-free flavored water (i.e. Fruit H2O, Propel) °• Decaffeinated coffee or tea (sugar-free) °• Crystal Lite, Wyler’s Lite,  Minute Maid Lite °• Sugar-free Jell-O °• Bouillon or broth °• Sugar-free Popsicle:   *Less than 20 calories each; Limit 1 per day ° °Full Liquids: °Protein Shakes/Drinks + 2 choices per day of other full liquids °• Full liquids must be: °o No More Than 15 grams of Carbs per serving  °o No More Than 3 grams of Fat per serving °• Strained low-fat cream soup (except Cream of Potato or Tomato) °• Non-Fat milk °• Fat-free Lactaid Milk °• Unsweetened Soy Or Unsweetened Almond Milk °• Low Sugar yogurt (Dannon Lite & Fit, Greek yogurt; Oikos Triple Zero; Chobani Simply 100; Yoplait 100 calorie Greek - No Fruit on the Bottom) ° °  °Vitamins   and Minerals • Start 1 day after surgery unless otherwise directed by your surgeon °• 2 Chewable Bariatric Specific Multivitamin / Multimineral Supplement with iron (Example: Bariatric Advantage Multi EA) °• Chewable Calcium with Vitamin D-3 °(Example: 3 Chewable Calcium Plus 600 with Vitamin D-3) °o Take 500 mg three (3) times a day for a total of 1500 mg each day °o Do not take all 3 doses of calcium at one time as it may cause constipation, and you can only absorb 500 mg  at a time  °o Do not mix multivitamins containing iron with calcium supplements; take 2 hours apart °• Menstruating women and those with a history of anemia (a blood disease that causes weakness) may need extra iron °o Talk with your doctor to see if you need more iron °• Do not stop taking or change any vitamins or minerals until you talk to your dietitian or surgeon °• Your Dietitian and/or surgeon must approve all vitamin and mineral supplements °  °Activity and Exercise: Limit your physical activity as instructed by your doctor.  It is important to continue walking at home.  During this time, use these guidelines: °• Do not lift anything greater than ten (10) pounds for at least two (2) weeks °• Do not go back to work or drive until your surgeon says you can °• You may have sex when you feel comfortable  °o It is  VERY important for female patients to use a reliable birth control method; fertility often increases after surgery  °o All hormonal birth control will be ineffective for 30 days after surgery due to medications given during surgery a barrier method must be used. °o Do not get pregnant for at least 18 months °• Start exercising as soon as your doctor tells you that you can °o Make sure your doctor approves any physical activity °• Start with a simple walking program °• Walk 5-15 minutes each day, 7 days per week.  °• Slowly increase until you are walking 30-45 minutes per day °Consider joining our BELT program. [phone] or email belt@[employer] °  °Special Instructions Things to remember: °• Use your CPAP when sleeping if this applies to you ° °• [facility] has two free Bariatric Surgery Support Groups that meet monthly °o The 3rd Thursday of each month, 6 pm, [facility]  °o The 2nd Friday of each month, 11:45 am in the private dining room in the basement of [place] °• It is very important to keep all follow up appointments with your surgeon, dietitian, primary care physician, and behavioral health practitioner °• Routine follow up schedule with your surgeon include appointments at 2-3 weeks, 6-8 weeks, 6 months, and 1 year at a minimum.  Your surgeon may request to see you more often.   °o After the first year, please follow up with your bariatric surgeon and dietitian at least once a year in order to maintain best weight loss results °[facility]: [phone] °[facility]: [phone] °Bariatric Nurse Coordinator: [phone] °  °   Reviewed and Endorsed  °by  Patient Education Committee, June, 2016 °Edits Approved: Aug, 2018 ° ° ° °

## 2019-02-02 NOTE — Patient Outreach (Signed)
[facility]) Care Management  02/02/2019  Mary Pham 1985-02-11 [id number]   Case Closure  Surgery Procedure Date 02/02/19 Insurance: [employer]   Patient will be followed in [place] Discharge interactive voice response calls after she discharged from the [place] [facility] close to [facility] care Management at this time, will follow up with patient as indicated if EMMI  responses trigger a red flag alert .    Mary Draft, RN, [place] Management Coordinator  [phone]- Mobile [phone]- Toll Free Main Office

## 2019-02-02 NOTE — Anesthesia Preprocedure Evaluation (Signed)
Anesthesia Evaluation  Patient identified by MRN, date of birth, ID band Patient awake    Reviewed: Allergy & Precautions, NPO status , Patient's Chart, lab work & pertinent test results  Airway Mallampati: III  TM Distance: >3 FB Neck ROM: Full    Dental no notable dental hx. (+) Teeth Intact   Pulmonary neg pulmonary ROS,    Pulmonary exam normal breath sounds clear to auscultation       Cardiovascular hypertension, Normal cardiovascular exam Rhythm:Regular Rate:Normal  Hx/o PIH 7 years ago, no HTN since then   Neuro/Psych  Headaches, negative psych ROS   GI/Hepatic Neg liver ROS, IBS   Endo/Other  Morbid obesity  Renal/GU negative Renal ROS  negative genitourinary   Musculoskeletal negative musculoskeletal ROS (+)   Abdominal (+) + obese,   Peds  Hematology negative hematology ROS (+)   Anesthesia Other Findings   Reproductive/Obstetrics                             Anesthesia Physical Anesthesia Plan  ASA: III  Anesthesia Plan: General   Post-op Pain Management:    Induction: Intravenous  PONV Risk Score and Plan: 4 or greater and Scopolamine patch - Pre-op, Ondansetron, Dexamethasone and Treatment may vary due to age or medical condition  Airway Management Planned: Oral ETT  Additional Equipment:   Intra-op Plan:   Post-operative Plan: Extubation in OR  Informed Consent: I have reviewed the patients History and Physical, chart, labs and discussed the procedure including the risks, benefits and alternatives for the proposed anesthesia with the patient or authorized representative who has indicated his/her understanding and acceptance.     Dental advisory given  Plan Discussed with: CRNA and Surgeon  Anesthesia Plan Comments:         Anesthesia Quick Evaluation

## 2019-02-02 NOTE — Op Note (Signed)
02 February 2019 Surgeon: Catalina Antigua B. Hassell Done, MD, FACS Asst:  Gurney Maxin, MD, FACS Anesthesia: General endotracheal Drains: None  Procedure: Laparoscopic Roux en Y gastric bypass with 40 cm BP limb and 100 cm Roux limb, antecolic, antegastric, candy cane to the left.  Closure of Peterson's defect. Upper endoscopy. Repair of hiatal hernia.  Description of Procedure:  The patient was taken to OR 1 at [facility] and given general anesthesia.  The abdomen was prepped with chloroprep and draped sterilely.  A time out was performed.    The operation began by identifying the ligament of Treitz. I measured 40 cm downstream and divided the bowel with a 6 cm Covidian stapler.  I sutured a Penrose drain along the Roux limb end.  I measured a 1 meter (100 cm) Roux limb and then placed the distal bowels to the BP limb side by side and performed a stapled jejunojejunostomy. The common defect was closed from either end with 4-0 Vicryl using the Endo Stitch. The mesenteric defect was closed with a running 2-0 silk using the Endo Stitch. Tisseel was applied to the suture line.  The omentum was divided with the harmonic scalpel.  The Nathanson retractor was inserted in the left lateral segment of liver was retracted. The foregut dissection ensued.  The hiatal hernia was repaired with 2 sutures placed posteriorly with 0 surgidek and the Endostitch with the TyKnot.  The pouch was created with multiple firings of the purple load ~ 5 cm from the EG junction.  The first two firings were done without TRS and then with TRS.    The Roux limb was then brought up with the candycane pointed left and a back row of sutures of 2-0 Vicryl were placed. I opened along the right side of each structure and inserted the 4.5 cm stapler to create the gastrojejunostomy. The common defect was closed from either end with 2-0 Vicryl and a second row was placed anterior to that the Ewald tube acting as a stent across the anastomosis. The Penrose drain  was removed. Peterson's defect was closed with 2-0 silk.   Endoscopy was performed by Dr Kieth Brightly and the pouch was 5 cm.  No bubbles were noted.    The incisions were injected with Exparel as a TAP block  (50 cc using 1% lidocaine) and were closed with 4-0 Monocryl and Dermabond.   The patient was taken to the recovery room in satisfactory condition.  [name] Done, MD, FACS

## 2019-02-02 NOTE — Op Note (Signed)
Preoperative diagnosis: Roux-en-Y gastric bypass  Postoperative diagnosis: Same   Procedure: Upper endoscopy   Surgeon: Gurney Maxin, M.D.  Anesthesia: Gen.   Indications for procedure: This patient was undergoing a Roux-en-Y gastric bypass.   Description of procedure: The endoscopy was placed in the mouth and into the oropharynx and under endoscopic vision it was advanced to the esophagogastric junction. The pouch was insufflated and no bleeding or bubbles were seen. The GEJ was identified at 35cm from the teeth. The anastomosis was seen at 40 cm. No bleeding or leaks were detected. The scope was withdrawn without difficulty.   Gurney Maxin, M.D. General, Bariatric, & Minimally Invasive Surgery [facility]

## 2019-02-02 NOTE — Progress Notes (Signed)
Discussed post op day goals with patient including ambulation, IS, diet progression, pain, and nausea control.  BSTOP education provided including BSTOP information guide, "Guide for Pain Management after your Bariatric Procedure".  Questions answered. 

## 2019-02-02 NOTE — Progress Notes (Signed)
PHARMACY CONSULT FOR:  Risk Assessment for Post-Discharge VTE Following Bariatric Surgery  Post-Discharge VTE Risk Assessment: This patient's probability of 30-day post-discharge VTE is increased due to the factors marked:   Female    Age >/=60 years    BMI >/=50 kg/m2    CHF    Dyspnea at Rest    Paraplegia  X  Non-gastric-band surgery    Operation Time >/=3 hr    Return to OR     Length of Stay >/= 3 d      Hx of VTE   Hypercoagulable condition   Significant venous stasis   Predicted probability of 30-day post-discharge VTE: 0.16%  Other patient-specific factors to consider: none   Recommendation for Discharge: No pharmacologic prophylaxis post-discharge  Mary Pham is a 34 y.o. female who underwent laparoscopic Roux-en-Y gastric bypass 02/02/2019   No Known Allergies  Patient Measurements: Weight: 266 lb 9.6 oz (120.9 kg) Body mass index is 44.36 kg/m.  No results for input(s): WBC, HGB, HCT, PLT, APTT, CREATININE, LABCREA, CREATININE, CREAT24HRUR, MG, PHOS, ALBUMIN, PROT, ALBUMIN, AST, ALT, ALKPHOS, BILITOT, BILIDIR, IBILI in the last 72 hours. Estimated Creatinine Clearance: 132.9 mL/min (by C-G formula based on SCr of 0.72 mg/dL).    Past Medical History:  Diagnosis Date  . Contraceptive education 10/09/2012  . IBS (irritable bowel syndrome)   . Irregular bleeding 10/09/2012  . Migraines    during pregnancy  . Pregnancy induced hypertension    during pregnancy  7 yeasrs ago     Medications Prior to Admission  Medication Sig Dispense Refill Last Dose  . etonogestrel (NEXPLANON) 68 MG IMPL implant Inject 1 each into the skin once.     . Multiple Vitamins-Minerals (BARIATRIC FUSION PO) Take 1 tablet by mouth daily.   02/01/2019 at Unknown time      Eudelia Bunch, Pharm.D [phone] 02/02/2019 1:51 PM

## 2019-02-02 NOTE — Interval H&P Note (Signed)
History and Physical Interval Note:  02/02/2019 9:05 AM  Mary Pham  has presented today for surgery, with the diagnosis of Morbid Obesity.  The various methods of treatment have been discussed with the patient and family. After consideration of risks, benefits and other options for treatment, the patient has consented to  Procedure(s): LAPAROSCOPIC ROUX-EN-Y GASTRIC BYPASS WITH UPPER ENDOSCOPY, Upper Endo-ERAS Pathway (N/A) as a surgical intervention.  The patient's history has been reviewed, patient examined, no change in status, stable for surgery.  I have reviewed the patient's chart and labs.  Questions were answered to the patient's satisfaction.     Pedro Earls

## 2019-02-02 NOTE — Anesthesia Postprocedure Evaluation (Addendum)
Anesthesia Post Note  Patient: Mary Pham  Procedure(s) Performed: LAPAROSCOPIC ROUX-EN-Y GASTRIC BYPASS WITH UPPER ENDOSCOPY, Upper Endo-ERAS Pathway (N/A Abdomen)     Patient location during evaluation: PACU Anesthesia Type: General Level of consciousness: awake and alert and oriented Pain management: pain level controlled Vital Signs Assessment: post-procedure vital signs reviewed and stable Respiratory status: spontaneous breathing, nonlabored ventilation and respiratory function stable Cardiovascular status: blood pressure returned to baseline and stable Postop Assessment: no apparent nausea or vomiting Anesthetic complications: no    Last Vitals:  Vitals:   02/02/19 1415 02/02/19 1433  BP: 133/72 139/74  Pulse: 97 82  Resp:  18  Temp: (!) 36.3 C (!) 36.3 C  SpO2: 100% 100%    Last Pain:  Vitals:   02/02/19 1433  TempSrc: Oral  PainSc: 5                  [name]

## 2019-02-02 NOTE — Anesthesia Procedure Notes (Signed)
Procedure Name: Intubation Date/Time: 02/02/2019 9:57 AM Performed by: Lavina Hamman, CRNA Pre-anesthesia Checklist: Patient identified, Emergency Drugs available, Suction available, Patient being monitored and Timeout performed Patient Re-evaluated:Patient Re-evaluated prior to induction Oxygen Delivery Method: Circle system utilized Preoxygenation: Pre-oxygenation with 100% oxygen Induction Type: IV induction Ventilation: Mask ventilation without difficulty Laryngoscope Size: Mac and 4 Grade View: Grade I Tube type: Oral Tube size: 7.5 mm Number of attempts: 1 Airway Equipment and Method: Stylet Placement Confirmation: ETT inserted through vocal cords under direct vision,  positive ETCO2,  CO2 detector and breath sounds checked- equal and bilateral Secured at: 22 cm Tube secured with: Tape Dental Injury: Teeth and Oropharynx as per pre-operative assessment

## 2019-02-03 LAB — CBC WITH DIFFERENTIAL/PLATELET
Abs Immature Granulocytes: 0.05 10*3/uL (ref 0.00–0.07)
Basophils Absolute: 0 10*3/uL (ref 0.0–0.1)
Basophils Relative: 0 %
Eosinophils Absolute: 0 10*3/uL (ref 0.0–0.5)
Eosinophils Relative: 0 %
HCT: 41.9 % (ref 36.0–46.0)
Hemoglobin: 13.6 g/dL (ref 12.0–15.0)
Immature Granulocytes: 1 %
Lymphocytes Relative: 6 %
Lymphs Abs: 0.6 10*3/uL — ABNORMAL LOW (ref 0.7–4.0)
MCH: 26.7 pg (ref 26.0–34.0)
MCHC: 32.5 g/dL (ref 30.0–36.0)
MCV: 82.2 fL (ref 80.0–100.0)
Monocytes Absolute: 0.4 10*3/uL (ref 0.1–1.0)
Monocytes Relative: 4 %
Neutro Abs: 8.7 10*3/uL — ABNORMAL HIGH (ref 1.7–7.7)
Neutrophils Relative %: 89 %
Platelets: 189 10*3/uL (ref 150–400)
RBC: 5.1 MIL/uL (ref 3.87–5.11)
RDW: 14.6 % (ref 11.5–15.5)
WBC: 9.8 10*3/uL (ref 4.0–10.5)
nRBC: 0 % (ref 0.0–0.2)

## 2019-02-03 MED ORDER — ONDANSETRON 4 MG PO TBDP: 4.0000 mg | ORAL_TABLET | Freq: Four times a day (QID) | ORAL | 0 refills | Status: DC | PRN

## 2019-02-03 MED ORDER — TRAMADOL HCL 50 MG PO TABS: 50.0000 mg | ORAL_TABLET | Freq: Four times a day (QID) | ORAL | 0 refills | Status: DC | PRN

## 2019-02-03 MED ORDER — PANTOPRAZOLE SODIUM 40 MG PO TBEC: 40.0000 mg | DELAYED_RELEASE_TABLET | Freq: Every day | ORAL | 0 refills | Status: DC

## 2019-02-03 MED FILL — traMADol HCL 50 MG TABS: 50 | 7 days supply | Qty: 30 | Fill #0

## 2019-02-03 MED FILL — ONDANSETRON ODT 4 MG TABLET: 4 | 5 days supply | Qty: 20 | Fill #0

## 2019-02-03 MED FILL — PANTOPRAZOLE SOD DR 40 MG T: 40 | 90 days supply | Qty: 90 | Fill #0

## 2019-02-03 NOTE — Progress Notes (Signed)
Pt has ambulated around unit 1x, demonstrated IS use, and finished 360 cc of water and 240 cc of protein. Pain is controlled and patient has no other needs at this time.

## 2019-02-03 NOTE — Progress Notes (Signed)
D/C instructions given to patient by Bariatric nurse. Patient had no questions. NT or writer will wheel patient out once family brings the car to the front of the building

## 2019-02-03 NOTE — Progress Notes (Signed)
Patient alert and oriented, pain is controlled. Patient is tolerating fluids, advanced to protein shake today, patient is tolerating well. Reviewed Gastric Bypass discharge instructions with patient and patient is able to articulate understanding. Provided information on BELT program, Support Group and [facility] outpatient pharmacy. All questions answered, will continue to monitor.   Total fluid intake 850 Per dehydration protocol call back one week post op

## 2019-02-03 NOTE — Progress Notes (Signed)
Pt verbalizes that codeine products make her stomach cramp. MD paged for alternative pain medication.

## 2019-02-03 NOTE — Discharge Summary (Signed)
Physician Discharge Summary  Patient ID: Mary Pham MRN: [id number] DOB/AGE: Feb 14, 1985 34 y.o.  PCP: Rory Percy, MD  Admit date: 02/02/2019 Discharge date: 02/03/2019  Admission Diagnoses:  Morbid obesity and GERD  Discharge Diagnoses:  same  Principal Problem:   History of Roux-en-Y gastric bypass Jan 2021   Surgery:  Lap posterior repair (2 sutures) hiatal hernia and lap roux en Y gastric bypass  Discharged Condition: improved  Hospital Course:   Had surgery on Monday and ready for discharge on Tuesday.    Consults: none  Significant Diagnostic Studies: none    Discharge Exam: Blood pressure (!) 149/93, pulse 70, temperature 98 F (36.7 C), temperature source Oral, resp. rate 16, height 5\' 5"  (1.651 m), weight 120.9 kg, SpO2 97 %. Incisions OK  Disposition: Discharge disposition: 01-Home or Self Care       Discharge Instructions    Ambulate hourly while awake   Complete by: As directed    Call MD for:  difficulty breathing, headache or visual disturbances   Complete by: As directed    Call MD for:  persistant dizziness or light-headedness   Complete by: As directed    Call MD for:  persistant nausea and vomiting   Complete by: As directed    Call MD for:  redness, tenderness, or signs of infection (pain, swelling, redness, odor or green/yellow discharge around incision site)   Complete by: As directed    Call MD for:  severe uncontrolled pain   Complete by: As directed    Call MD for:  temperature >101 F   Complete by: As directed    Diet bariatric full liquid   Complete by: As directed    Incentive spirometry   Complete by: As directed    Perform hourly while awake   No wound care   Complete by: As directed      Allergies as of 02/03/2019      Reactions   Guaifenesin-codeine    Stomach cramping      Medication List    TAKE these medications   BARIATRIC FUSION PO Take 1 tablet by mouth daily.   Nexplanon 68 MG Impl implant Generic  drug: etonogestrel Inject 1 each into the skin once.   ondansetron 4 MG disintegrating tablet Commonly known as: ZOFRAN-ODT Take 1 tablet (4 mg total) by mouth every 6 (six) hours as needed for nausea or vomiting.   pantoprazole 40 MG tablet Commonly known as: PROTONIX Take 1 tablet (40 mg total) by mouth daily.   traMADol 50 MG tablet Commonly known as: ULTRAM Take 1 tablet (50 mg total) by mouth every 6 (six) hours as needed for moderate pain or severe pain.      Follow-up Information    [facility], [place]. Go on 02/26/2019.   Specialty: General Surgery Why: at 915 am Contact information: [address] [place] [address] [phone]        Carlena Hurl, PA-C. Go on 03/27/2019.   Specialty: General Surgery Why: at 9 am Contact information: [address] [place] [address] [phone]           Signed: Pedro Earls 02/03/2019, 9:54 AM

## 2019-02-03 NOTE — Progress Notes (Signed)
Nutrition Note  RD consulted for diet education for patient s/p bariatric surgery. Bariatric nurse coordinator providing education at this time.  If nutrition issues arise, please consult RD.   Clenton Esper, MS, RD, LDN Inpatient Clinical Dietitian Pager: [phone] After Hours Pager: [phone]  

## 2019-02-04 MED FILL — oxyCODONE HCL 5 MG TABS: 5 | 2 days supply | Qty: 15 | Fill #0

## 2019-02-06 ENCOUNTER — Ambulatory Visit: Payer: No Typology Code available for payment source | Admitting: Professional

## 2019-02-09 ENCOUNTER — Telehealth (HOSPITAL_COMMUNITY): Payer: Self-pay

## 2019-02-09 NOTE — Telephone Encounter (Signed)
Patient called to discuss post bariatric surgery follow up questions.  See below:   1.  Tell me about your pain and pain management?tylenol for last several days  2.  Let's talk about fluid intake.  How much total fluid are you taking in?62-64 oz  3.  How much protein have you taken in the last 2 days?60 gm  4.  Have you had nausea?  Tell me about when have experienced nausea and what you did to help?denies  5.  Has the frequency or color changed with your urine?urine light in color no problems with dizziness  6.  Tell me what your incisions look like?no problems  7.  Have you been passing gas? BM?miralax Thursday, bm Friday and several the following days  8.  If a problem or question were to arise who would you call?  Do you know contact numbers for [place], [facility], and [facility]?aware of how to contact all services  9.  How has the walking going?walking regularly  10.  How are your vitamins and calcium going?  How are you taking them?no problems with mvi or calcium did change to calcium citrate due to concerns over kidney stones as she has acquaintances with stones after bypass

## 2019-02-13 ENCOUNTER — Ambulatory Visit: Payer: No Typology Code available for payment source | Admitting: Professional

## 2019-02-17 ENCOUNTER — Other Ambulatory Visit: Payer: Self-pay

## 2019-02-17 ENCOUNTER — Encounter: Payer: No Typology Code available for payment source | Attending: Surgery | Admitting: Skilled Nursing Facility1

## 2019-02-17 DIAGNOSIS — E669 Obesity, unspecified: Secondary | ICD-10-CM | POA: Insufficient documentation

## 2019-02-18 NOTE — Progress Notes (Signed)
2 Week Post-Operative Nutrition Class   Patient was seen on 03/18/18 for Post-Operative Nutrition education at the [facility].    Surgery date: 02/02/2019 Surgery type: RYGB Start weight at [facility]: 283.5 Weight today: 258.2  broken  Body Composition Scale Date  Total Body Fat %   Visceral Fat   Fat-Free Mass %    Total Body Water %    Muscle-Mass lbs   Body Fat Displacement          Torso  lbs          Left Leg  lbs          Right Leg  lbs          Left Arm  lbs          Right Arm   lbs      The following the learning objectives were met by the patient during this course:  Identifies Phase 3 (Soft, High Proteins) Dietary Goals and will begi from 2 weeks post-operatively to 2 months post-operatively  Identifies appropriate sources of fluids and proteins   States protein recommendations and appropriate sources post-operatively  Identifies the need for appropriate texture modifications, mastication, and bite sizes when consuming solids  Identifies appropriate multivitamin and calcium sources post-operatively  Describes the need for physical activity post-operatively and will follow MD recommendations  States when to call healthcare provider regarding medication questions or post-operative complications   Handouts given during class include:  Phase 3A: Soft, High Protein Diet Handout   Follow-Up Plan: Patient will follow-up at [facility] in 6 weeks for 2 month post-op nutrition visit for diet advancement per MD.

## 2019-02-23 ENCOUNTER — Telehealth: Payer: Self-pay | Admitting: Skilled Nursing Facility1

## 2019-02-23 NOTE — Telephone Encounter (Signed)
RD called pt to verify fluid intake once starting soft, solid proteins 2 week post-bariatric surgery.  ° °Daily Fluid intake: 64+ °Daily Protein intake: 60+ ° °Concerns/issues:  ° °None stated °

## 2019-03-13 ENCOUNTER — Ambulatory Visit: Payer: No Typology Code available for payment source | Admitting: Obstetrics & Gynecology

## 2019-03-13 ENCOUNTER — Encounter: Payer: Self-pay | Admitting: Obstetrics & Gynecology

## 2019-03-13 ENCOUNTER — Other Ambulatory Visit: Payer: Self-pay

## 2019-03-13 VITALS — Ht 65.0 in | Wt 245.0 lb

## 2019-03-13 DIAGNOSIS — N939 Abnormal uterine and vaginal bleeding, unspecified: Secondary | ICD-10-CM

## 2019-03-13 DIAGNOSIS — Z3202 Encounter for pregnancy test, result negative: Secondary | ICD-10-CM

## 2019-03-13 DIAGNOSIS — N938 Other specified abnormal uterine and vaginal bleeding: Secondary | ICD-10-CM

## 2019-03-13 LAB — POCT URINE PREGNANCY: Preg Test, Ur: NEGATIVE

## 2019-03-13 LAB — POCT HEMOGLOBIN: Hemoglobin: 13.9 g/dL (ref 11–14.6)

## 2019-03-13 NOTE — Progress Notes (Signed)
Chief Complaint  Patient presents with  . Discuss Ablation    cylce for 10 weeks      34 y.o. G2P1001 No LMP recorded. Patient has had an implant. The current method of family planning is Nexplanon.  Outpatient Encounter Medications as of 03/13/2019  Medication Sig  . Calcium Carbonate-Vit D-Min (CALCIUM 1200 PO) Take by mouth.  . etonogestrel (NEXPLANON) 68 MG IMPL implant Inject 1 each into the skin once.  . Multiple Vitamins-Minerals (BARIATRIC FUSION PO) Take 1 tablet by mouth daily.  . [DISCONTINUED] ondansetron (ZOFRAN-ODT) 4 MG disintegrating tablet Take 1 tablet (4 mg total) by mouth every 6 (six) hours as needed for nausea or vomiting.  . [DISCONTINUED] pantoprazole (PROTONIX) 40 MG tablet Take 1 tablet (40 mg total) by mouth daily.  . [DISCONTINUED] traMADol (ULTRAM) 50 MG tablet Take 1 tablet (50 mg total) by mouth every 6 (six) hours as needed for moderate pain or severe pain.   No facility-administered encounter medications on file as of 03/13/2019.    Subjective Pt has been bleeding for 8-10 weeks No heavy brownish burgundy small amount daily Minimal cramping Does not soil Had Laparosopic Roux en Y 1/21 Has nexpalnon in place Used megestrol prescription algorithm without success Hemoglobin 13.9 today Past Medical History:  Diagnosis Date  . Contraceptive education 10/09/2012  . IBS (irritable bowel syndrome)   . Irregular bleeding 10/09/2012  . Migraines    during pregnancy  . Pregnancy induced hypertension    during pregnancy  7 yeasrs ago    Past Surgical History:  Procedure Laterality Date  . CESAREAN SECTION  02/01/2012   Procedure: CESAREAN SECTION;  Surgeon: Guss Bunde, MD;  Location: [facility];  Service: Obstetrics;  Laterality: N/A;  Primary Cesarean Section Delivery Baby Boy @ 0221, Apgars 7/9  . CHOLECYSTECTOMY    . GASTRIC ROUX-EN-Y N/A 02/02/2019   Procedure: LAPAROSCOPIC ROUX-EN-Y GASTRIC BYPASS WITH UPPER ENDOSCOPY, Upper Endo-ERAS  Pathway;  Surgeon: Johnathan Hausen, MD;  Location: [facility];  Service: General;  Laterality: N/A;  . nexplanon implant  12/11/2018   in the office    OB History    Gravida  2   Para  1   Term  1   Preterm  0   AB  0   Living  1     SAB  0   TAB  0   Ectopic  0   Multiple  0   Live Births  1           Allergies  Allergen Reactions  . Guaifenesin-Codeine     Stomach cramping    Social History   Socioeconomic History  . Marital status: Married    Spouse name: Not on file  . Number of children: Not on file  . Years of education: Not on file  . Highest education level: Not on file  Occupational History  . Not on file  Tobacco Use  . Smoking status: Never Smoker  . Smokeless tobacco: Never Used  Substance and Sexual Activity  . Alcohol use: No  . Drug use: No  . Sexual activity: Yes    Birth control/protection: Implant    Comment: Nexplanon placed 12/11/2018  Other Topics Concern  . Not on file  Social History Narrative  . Not on file   Social Determinants of Health   Financial Resource Strain:   . Difficulty of Paying Living Expenses: Not on file  Food Insecurity:   . Worried About  Running Out of Food in the Last Year: Not on file  . Ran Out of Food in the Last Year: Not on file  Transportation Needs:   . Lack of Transportation (Medical): Not on file  . Lack of Transportation (Non-Medical): Not on file  Physical Activity:   . Days of Exercise per Week: Not on file  . Minutes of Exercise per Session: Not on file  Stress:   . Feeling of Stress : Not on file  Social Connections:   . Frequency of Communication with Friends and Family: Not on file  . Frequency of Social Gatherings with Friends and Family: Not on file  . Attends Religious Services: Not on file  . Active Member of Clubs or Organizations: Not on file  . Attends Archivist Meetings: Not on file  . Marital Status: Not on file    Family History  Problem Relation Age of  Onset  . Hypertension Father   . Diabetes Father   . Heart disease Maternal Grandmother   . Heart disease Maternal Grandfather   . Cancer Paternal Grandmother        lung  . Stroke Paternal Grandfather     Medications:       Current Outpatient Medications:  .  Calcium Carbonate-Vit D-Min (CALCIUM 1200 PO), Take by mouth., Disp: , Rfl:  .  etonogestrel (NEXPLANON) 68 MG IMPL implant, Inject 1 each into the skin once., Disp: , Rfl:  .  Multiple Vitamins-Minerals (BARIATRIC FUSION PO), Take 1 tablet by mouth daily., Disp: , Rfl:   Objective Height 5\' 5"  (1.651 m), weight 245 lb (111.1 kg).  Gen [facility] NAD  Pertinent ROS No burning with urination, frequency or urgency No nausea, vomiting or diarrhea Nor fever chills or other constitutional symptoms   Labs or studies reviewed    Impression Diagnoses this Encounter::   ICD-10-CM   1. DUB (dysfunctional uterine bleeding)  N93.8    progesterone only method in place, faile oral megestrol therapy as well  2. Urine pregnancy test negative  Z32.02 POCT urine pregnancy  3. Vaginal bleeding  N93.9 POCT hemoglobin    Established relevant diagnosis(es):   Plan/Recommendations: No orders of the defined types were placed in this encounter.   Labs or Scans Ordered: Orders Placed This Encounter  Procedures  . POCT urine pregnancy  . POCT hemoglobin    Management:: >pt to consider Mirena IUD as a management of dys synchronous endometrial bleeding with nexplanon in place >also has option of endometrial ablation for management but I encourage her to consider the IUD as the mot right first option  She is going to give it some thought and discuss with her husband, she needs a Pap anyway so she may do both at one time She understand if the IUD is also uncsuccessful ablation would be next option in management  Follow up No follow-ups on file.        Face to face time:  15 minutes  Greater than 50% of the visit time was  spent in counseling and coordination of care with the patient.  The summary and outline of the counseling and care coordination is summarized in the note above.   All questions were answered.

## 2019-03-26 ENCOUNTER — Other Ambulatory Visit: Payer: Self-pay | Admitting: Physician Assistant

## 2019-03-26 DIAGNOSIS — Z86018 Personal history of other benign neoplasm: Secondary | ICD-10-CM

## 2019-03-26 HISTORY — DX: Personal history of other benign neoplasm: Z86.018

## 2019-03-27 ENCOUNTER — Ambulatory Visit (INDEPENDENT_AMBULATORY_CARE_PROVIDER_SITE_OTHER): Payer: No Typology Code available for payment source | Admitting: Obstetrics & Gynecology

## 2019-03-27 ENCOUNTER — Other Ambulatory Visit (HOSPITAL_COMMUNITY)
Admission: RE | Admit: 2019-03-27 | Discharge: 2019-03-27 | Disposition: A | Discharge status: Home / Self Care | Payer: No Typology Code available for payment source | Source: Ambulatory Visit | Attending: Obstetrics & Gynecology | Admitting: Obstetrics & Gynecology

## 2019-03-27 ENCOUNTER — Encounter: Payer: Self-pay | Admitting: Obstetrics & Gynecology

## 2019-03-27 ENCOUNTER — Other Ambulatory Visit: Payer: Self-pay

## 2019-03-27 VITALS — BP 134/87 | HR 71 | Ht 65.0 in | Wt 235.0 lb

## 2019-03-27 DIAGNOSIS — Z3202 Encounter for pregnancy test, result negative: Secondary | ICD-10-CM | POA: Diagnosis not present

## 2019-03-27 DIAGNOSIS — Z01419 Encounter for gynecological examination (general) (routine) without abnormal findings: Secondary | ICD-10-CM

## 2019-03-27 DIAGNOSIS — N938 Other specified abnormal uterine and vaginal bleeding: Secondary | ICD-10-CM

## 2019-03-27 DIAGNOSIS — Z30430 Encounter for insertion of intrauterine contraceptive device: Secondary | ICD-10-CM | POA: Diagnosis not present

## 2019-03-27 LAB — POCT URINE PREGNANCY: Preg Test, Ur: NEGATIVE

## 2019-03-27 MED ORDER — LEVONORGESTREL 20 MCG/24HR IU IUD: INTRAUTERINE_SYSTEM | Freq: Once | INTRAUTERINE | Status: AC

## 2019-03-27 NOTE — Progress Notes (Signed)
Subjective:     Mary Pham is a 34 y.o. female here for a routine exam.  No LMP recorded. Patient has had an implant. G1P1001 Birth Control Method:  Nexplanon Menstrual Calendar(currently): irregular  Current complaints: DUB.   Current acute medical issues:  none   Recent Gynecologic History No LMP recorded. Patient has had an implant. Last Pap: 2017,  normal Last mammogram: n/a,    Past Medical History:  Diagnosis Date  . Contraceptive education 10/09/2012  . IBS (irritable bowel syndrome)   . Irregular bleeding 10/09/2012  . Migraines    during pregnancy  . Pregnancy induced hypertension    during pregnancy  7 yeasrs ago    Past Surgical History:  Procedure Laterality Date  . CESAREAN SECTION  02/01/2012   Procedure: CESAREAN SECTION;  Surgeon: Guss Bunde, MD;  Location: [facility];  Service: Obstetrics;  Laterality: N/A;  Primary Cesarean Section Delivery Baby Boy @ 0221, Apgars 7/9  . CHOLECYSTECTOMY    . GASTRIC ROUX-EN-Y N/A 02/02/2019   Procedure: LAPAROSCOPIC ROUX-EN-Y GASTRIC BYPASS WITH UPPER ENDOSCOPY, Upper Endo-ERAS Pathway;  Surgeon: Johnathan Hausen, MD;  Location: [facility];  Service: General;  Laterality: N/A;  . nexplanon implant  12/11/2018   in the office    OB History    Gravida  1   Para  1   Term  1   Preterm  0   AB  0   Living  1     SAB  0   TAB  0   Ectopic  0   Multiple  0   Live Births  1           Social History   Socioeconomic History  . Marital status: Married    Spouse name: Not on file  . Number of children: Not on file  . Years of education: Not on file  . Highest education level: Not on file  Occupational History  . Not on file  Tobacco Use  . Smoking status: Never Smoker  . Smokeless tobacco: Never Used  Substance and Sexual Activity  . Alcohol use: No  . Drug use: No  . Sexual activity: Yes    Birth control/protection: Implant    Comment: Nexplanon placed 12/11/2018  Other Topics Concern  . Not on  file  Social History Narrative  . Not on file   Social Determinants of Health   Financial Resource Strain:   . Difficulty of Paying Living Expenses: Not on file  Food Insecurity:   . Worried About Charity fundraiser in the Last Year: Not on file  . Ran Out of Food in the Last Year: Not on file  Transportation Needs:   . Lack of Transportation (Medical): Not on file  . Lack of Transportation (Non-Medical): Not on file  Physical Activity:   . Days of Exercise per Week: Not on file  . Minutes of Exercise per Session: Not on file  Stress:   . Feeling of Stress : Not on file  Social Connections:   . Frequency of Communication with Friends and Family: Not on file  . Frequency of Social Gatherings with Friends and Family: Not on file  . Attends Religious Services: Not on file  . Active Member of Clubs or Organizations: Not on file  . Attends Archivist Meetings: Not on file  . Marital Status: Not on file    Family History  Problem Relation Age of Onset  . Hypertension Father   .  Diabetes Father   . Heart disease Maternal Grandmother   . Heart disease Maternal Grandfather   . Cancer Paternal Grandmother        lung  . Stroke Paternal Grandfather      Current Outpatient Medications:  .  Calcium Carbonate-Vit D-Min (CALCIUM 1200 PO), Take by mouth., Disp: , Rfl:  .  etonogestrel (NEXPLANON) 68 MG IMPL implant, Inject 1 each into the skin once., Disp: , Rfl:  .  Multiple Vitamins-Minerals (BARIATRIC FUSION PO), Take 1 tablet by mouth daily., Disp: , Rfl:   Review of Systems  Review of Systems  Constitutional: Negative for fever, chills, weight loss, malaise/fatigue and diaphoresis.  HENT: Negative for hearing loss, ear pain, nosebleeds, congestion, sore throat, neck pain, tinnitus and ear discharge.   Eyes: Negative for blurred vision, double vision, photophobia, pain, discharge and redness.  Respiratory: Negative for cough, hemoptysis, sputum production, shortness  of breath, wheezing and stridor.   Cardiovascular: Negative for chest pain, palpitations, orthopnea, claudication, leg swelling and PND.  Gastrointestinal: negative for abdominal pain. Negative for heartburn, nausea, vomiting, diarrhea, constipation, blood in stool and melena.  Genitourinary: Negative for dysuria, urgency, frequency, hematuria and flank pain.  Musculoskeletal: Negative for myalgias, back pain, joint pain and falls.  Skin: Negative for itching and rash.  Neurological: Negative for dizziness, tingling, tremors, sensory change, speech change, focal weakness, seizures, loss of consciousness, weakness and headaches.  Endo/Heme/Allergies: Negative for environmental allergies and polydipsia. Does not bruise/bleed easily.  Psychiatric/Behavioral: Negative for depression, suicidal ideas, hallucinations, memory loss and substance abuse. The patient is not nervous/anxious and does not have insomnia.        Objective:  Blood pressure 134/87, pulse 71, height 5\' 5"  (1.651 m), weight 235 lb (106.6 kg).   Physical Exam  Vitals reviewed. Constitutional: She is oriented to person, place, and time. She appears well-developed and well-nourished.  HENT:  Head: Normocephalic and atraumatic.        Right Ear: External ear normal.  Left Ear: External ear normal.  Nose: Nose normal.  Mouth/Throat: Oropharynx is clear and moist.  Eyes: Conjunctivae and EOM are normal. Pupils are equal, round, and reactive to light. Right eye exhibits no discharge. Left eye exhibits no discharge. No scleral icterus.  Neck: Normal range of motion. Neck supple. No tracheal deviation present. No thyromegaly present.  Cardiovascular: Normal rate, regular rhythm, normal heart sounds and intact distal pulses.  Exam reveals no gallop and no friction rub.   No murmur heard. Respiratory: Effort normal and breath sounds normal. No respiratory distress. She has no wheezes. She has no rales. She exhibits no tenderness.  GI:  Soft. Bowel sounds are normal. She exhibits no distension and no mass. There is no tenderness. There is no rebound and no guarding.  Genitourinary:  Breasts no masses skin changes or nipple changes bilaterally      Vulva is normal without lesions Vagina is pink moist without discharge Cervix normal in appearance and pap is done Uterus is normal size shape and contour Adnexa is negative with normal sized ovaries   Musculoskeletal: Normal range of motion. She exhibits no edema and no tenderness.  Neurological: She is alert and oriented to person, place, and time. She has normal reflexes. She displays normal reflexes. No cranial nerve deficit. She exhibits normal muscle tone. Coordination normal.  Skin: Skin is warm and dry. No rash noted. No erythema. No pallor.  Psychiatric: She has a normal mood and affect. Her behavior is normal. Judgment and  thought content normal.       Medications Ordered at today's visit: No orders of the defined types were placed in this encounter.   Other orders placed at today's visit: Orders Placed This Encounter  Procedures  . POCT urine pregnancy      Assessment:    Healthy female exam.   DUB on nexplanon, unresponsive to megestrol synchonization Plan:    Contraception: Nexplanon. Follow up in: 3 years. prn     No follow-ups on file.   IUD Insertion Procedure Note  Pre-operative Diagnosis: DUB, unresponsive to megestrol, has nexplanon  Post-operative Diagnosis: same  Indications: DUB management  Procedure Details  Urine pregnancy test was not done.  The risks (including infection, bleeding, pain, and uterine perforation) and benefits of the procedure were explained to the patient and Written informed consent was obtained.    Cervix cleansed with Betadine. Uterus sounded to 7 cm. IUD inserted without difficulty. String visible and trimmed. Patient tolerated procedure well.  IUD Information: Mirena, Lot # [id number], Expiration date  03/2024.  Condition: Stable  Complications: None  Plan:  The patient was advised to call for any fever or for prolonged or severe pain or bleeding. She was advised to use OTC analgesics as needed for mild to moderate pain.   Attending Physician Documentation: I was present for or performed the following: IUD insertion  Florian Buff, MD 03/27/2019

## 2019-03-27 NOTE — Addendum Note (Signed)
Addended by: Linton Rump on: 03/27/2019 10:05 AM   Modules accepted: Orders

## 2019-03-31 LAB — CYTOLOGY - PAP
Comment: NEGATIVE
Diagnosis: NEGATIVE
High risk HPV: NEGATIVE

## 2019-04-01 ENCOUNTER — Encounter: Payer: Self-pay | Admitting: Dietician

## 2019-04-01 ENCOUNTER — Encounter: Payer: No Typology Code available for payment source | Attending: Surgery | Admitting: Dietician

## 2019-04-01 ENCOUNTER — Other Ambulatory Visit: Payer: Self-pay

## 2019-04-01 DIAGNOSIS — Z9884 Bariatric surgery status: Secondary | ICD-10-CM

## 2019-04-01 DIAGNOSIS — E669 Obesity, unspecified: Secondary | ICD-10-CM | POA: Diagnosis present

## 2019-04-01 NOTE — Patient Instructions (Signed)

## 2019-04-01 NOTE — Progress Notes (Signed)
Bariatric Nutrition Follow-Up Visit Medical Nutrition Therapy  Appt Start Time: 3:00pm    End Time: 3:30pm  2 Months Post-Operative RYGB Surgery Surgery Date: 02/02/2019  Pt's Expectations of Surgery/ Goals: to lose and maintain weight loss Pt Reported Successes: feels better, more energy, more confidence/self-love   NUTRITION ASSESSMENT  Anthropometrics  Start weight at [facility]: 283.5 lbs (date: 11/28/2018) Today's weight: 235 lbs  Body Composition Scale 04/01/2019  Weight  lbs 235  BMI 39  Total Body Fat  % -     Visceral Fat -  Fat-Free Mass  % -     Total Body Water  % -     Muscle-Mass  lbs -  Body Fat Displacement ---         Torso  lbs -         Left Leg  lbs -         Right Leg  lbs -         Left Arm  lbs -         Right Arm  lbs -    Lifestyle & Dietary Hx Typical meal pattern is 5-6 meals/snacks per day. States that she has had to incorporate more protein shakes lately because of busy works schedule, but otherwise eats most of her protein. Makes egg salad with low-fat mayo or [place] yogurt. Ricotta bake, chicken, beef, protein shakes, cheese, pepperoni, yogurt, etc are all proteins eaten. Asked if natural sugars (honey and maple syrup) are okay. Asked if adding additional biotin is acceptable. Doing well with physical activity and drinking fluids.   24-Hr Dietary Recall First Meal: scrambled eggs + 2% cheese  Snack: Greek yogurt + Lily's chocolate (sweetened with Stevia)   Second Meal: chicken  Snack: pepperoni + cheese  Third Meal: steak Snack: 1/2 protein shake  Beverages: water, fat-free Lactaid milk, Powerade Zero  Estimated daily fluid intake: 60+ oz Estimated daily protein intake: 60+ g Supplements: Bariatric Advantage capsule, calcium  Current average weekly physical activity: walking, video workout classes  Post-Op Goals/ Signs/ Symptoms Using straws: no Drinking while eating: no  Chewing/swallowing difficulties: no Changes in vision: no Changes to  mood/headaches: no Hair loss/changes to skin/nails: no Difficulty focusing/concentrating: no Sweating: no Dizziness/lightheadedness: no Palpitations: no  Carbonated/caffeinated beverages: no N/V/D/C/Gas: constipation  Abdominal pain: no Dumping syndrome: no   NUTRITION DIAGNOSIS  Overweight/obesity ([place]-3.3) related to past poor dietary habits and physical inactivity as evidenced by completed bariatric surgery and following dietary guidelines for continued weight loss and healthy nutrition status.   NUTRITION INTERVENTION Nutrition counseling (C-1) and education (E-2) to facilitate bariatric surgery goals, including: . Diet advancement to the next phase (phase 4) now including non-starchy vegetables . The importance of consuming adequate calories as well as certain nutrients daily due to the body's need for essential vitamins, minerals, and fats . The importance of daily physical activity and to reach a goal of at least 150 minutes of moderate to vigorous physical activity weekly (or as directed by their physician) due to benefits such as increased musculature and improved lab values  Handouts Provided Include   Phase 4: Protein + Non-Starchy Vegetables   Learning Style & Readiness for Change Teaching method utilized: Visual & Auditory  Demonstrated degree of understanding via: Teach Back  Barriers to learning/adherence to lifestyle change: None Identified    MONITORING & EVALUATION Dietary intake, weekly physical activity, body weight, and goals in 4 months.  Next Steps Patient is to follow-up in 4 months  for 6 month post-op follow-up.

## 2019-04-07 ENCOUNTER — Telehealth: Payer: Self-pay | Admitting: Obstetrics & Gynecology

## 2019-04-07 MED ORDER — SULFAMETHOXAZOLE-TRIMETHOPRIM 800-160 MG PO TABS: 1.0000 | ORAL_TABLET | Freq: Two times a day (BID) | ORAL | 0 refills | Status: DC

## 2019-04-07 MED FILL — SULFAMETHOXAZOLE-TMP DS TAB: 800-160 | 7 days supply | Qty: 14 | Fill #0

## 2019-04-21 ENCOUNTER — Encounter: Payer: Self-pay | Admitting: Physician Assistant

## 2019-04-21 ENCOUNTER — Ambulatory Visit (INDEPENDENT_AMBULATORY_CARE_PROVIDER_SITE_OTHER): Payer: No Typology Code available for payment source | Admitting: Physician Assistant

## 2019-04-21 ENCOUNTER — Other Ambulatory Visit: Payer: Self-pay

## 2019-04-21 DIAGNOSIS — D1720 Benign lipomatous neoplasm of skin and subcutaneous tissue of unspecified limb: Secondary | ICD-10-CM

## 2019-04-21 DIAGNOSIS — D485 Neoplasm of uncertain behavior of skin: Secondary | ICD-10-CM

## 2019-04-21 DIAGNOSIS — D1724 Benign lipomatous neoplasm of skin and subcutaneous tissue of left leg: Secondary | ICD-10-CM | POA: Diagnosis not present

## 2019-04-21 DIAGNOSIS — D489 Neoplasm of uncertain behavior, unspecified: Secondary | ICD-10-CM

## 2019-04-21 NOTE — Progress Notes (Addendum)
   Follow-Up Visit   Subjective  Mary Pham is a 34 y.o. female who presents for the following: Procedure (Patient here today for keloid removal left upper inner thigh x 2-3 years.  Patient denies pain, patient denies any bleeding.).  Location: Left inner thigh Duration: year Quality: soft mass Associated Signs/Symptoms:irritated Modifying Factors: persistent Severity: growing  The following portions of the chart were reviewed this encounter and updated as appropriate: Tobacco  Allergies  Meds  Problems  Med Hx  Surg Hx  Fam Hx      Objective  Well appearing patient in no apparent distress; mood and affect are within normal limits  "A full examination was performed including  Left inner thigh.  Objective  Left Medial Thigh: Soft large mass.   Assessment & Plan  Neoplasm of uncertain behavior Left Medial Thigh  Skin excision  Lesion length (cm):  3.5 Lesion width (cm):  3.5 Margin per side (cm):  0.1 Total excision diameter (cm):  3.7 Informed consent: discussed and consent obtained   Timeout: patient name, date of birth, surgical site, and procedure verified   Anesthesia: the lesion was anesthetized in a standard fashion   Anesthetic:  1% lidocaine w/ epinephrine 1-100,000 local infiltration Instrument used: #15 blade   Hemostasis achieved with: pressure and electrodesiccation   Outcome: patient tolerated procedure well with no complications   Post-procedure details: sterile dressing applied and wound care instructions given   Dressing type: bandage, petrolatum and pressure dressing    Skin repair Complexity:  Simple Final length (cm):  3.7 Informed consent: discussed and consent obtained   Timeout: patient name, date of birth, surgical site, and procedure verified   Procedure prep:  Patient was prepped and draped in usual sterile fashion (non sterile) Prep type:  Chlorhexidine Anesthesia: the lesion was anesthetized in a standard fashion   Undermining:  edges undermined   Fine/surface layer approximation (top stitches):  Suture type: Vicryl Rapide (coated polyglactin 910) and Prolene (polypropylene)   Suture type comment:  Nylon Stitches: simple running   Suture removal (days):  14 Hemostasis achieved with: suture, pressure and electrodesiccation Outcome: patient tolerated procedure well with no complications   Post-procedure details: wound care instructions given   Post-procedure details comment:  Non sterile pressure   Specimen 1 - Surgical pathology Differential Diagnosis: lipoma Check Margins: No Soft large mass.

## 2019-04-28 ENCOUNTER — Telehealth: Payer: Self-pay

## 2019-04-28 NOTE — Telephone Encounter (Signed)
Pathology given to patient follow up PRN.

## 2019-04-28 NOTE — Telephone Encounter (Signed)
-----   Message from Warren Danes, [place] sent at 04/28/2019 12:52 PM EDT ----- RTC PRN

## 2019-05-08 ENCOUNTER — Telehealth: Payer: Self-pay | Admitting: Dietician

## 2019-05-08 MED FILL — NYSTATIN 100000 UNIT/GM POW: 100000 | 7 days supply | Qty: 15 | Fill #0

## 2019-05-08 NOTE — Telephone Encounter (Signed)
Patient called with questions and concerns regarding hair loss. States she has incorporated biotin for the past couple of weeks and recently added collagen as well. Also continues to take bariatric MVI. We discussed the importance of these micronutrients as well as sufficient protein intake to help prevent hair loss. We went over how hair loss is common after bariatric surgery d/t weight loss as well as the malabsorptive effects of the surgery itself.   Plan to follow up at [place] in July for next nutrition visit.   Mary Christen [place]) Neeley Sedivy, MS, RD, LDN

## 2019-05-19 MED FILL — NYSTATIN 100000 UNIT/GM POW: 100000 | 7 days supply | Qty: 15 | Fill #0

## 2019-05-22 ENCOUNTER — Encounter: Payer: No Typology Code available for payment source | Admitting: Physician Assistant

## 2019-06-09 ENCOUNTER — Telehealth: Payer: Self-pay

## 2019-06-09 DIAGNOSIS — L732 Hidradenitis suppurativa: Secondary | ICD-10-CM

## 2019-06-09 NOTE — Telephone Encounter (Signed)
Patient wants to know if she can get a antibiotic called into her Pharmacy for her HS?  Patient uses [facility].

## 2019-06-11 ENCOUNTER — Other Ambulatory Visit: Payer: Self-pay

## 2019-06-11 MED ORDER — SULFAMETHOXAZOLE-TRIMETHOPRIM 800-160 MG PO TABS: 1.0000 | ORAL_TABLET | Freq: Two times a day (BID) | ORAL | 0 refills | Status: DC

## 2019-06-11 MED ORDER — SULFAMETHOXAZOLE-TRIMETHOPRIM 800-160 MG PO TABS: 1.0000 | ORAL_TABLET | Freq: Two times a day (BID) | ORAL | 6 refills | Status: DC

## 2019-06-11 MED FILL — SULFAMETHOXAZOLE-TMP DS TAB: 800-160 | 7 days supply | Qty: 14 | Fill #0

## 2019-06-11 NOTE — Telephone Encounter (Signed)
Prescription sent into patient's Pharmacy and patient aware.

## 2019-06-11 NOTE — Telephone Encounter (Signed)
Bactrim DS BID # [id number]

## 2019-07-28 NOTE — Addendum Note (Signed)
Addended by: Warren Danes on: 07/28/2019 08:39 AM   Modules accepted: Level of Service

## 2019-08-04 ENCOUNTER — Encounter: Payer: No Typology Code available for payment source | Attending: Surgery | Admitting: Skilled Nursing Facility1

## 2019-08-04 ENCOUNTER — Other Ambulatory Visit: Payer: Self-pay

## 2019-08-04 DIAGNOSIS — E669 Obesity, unspecified: Secondary | ICD-10-CM | POA: Diagnosis present

## 2019-08-06 NOTE — Progress Notes (Signed)
Follow-up visit:  Post-Operative RYGB Surgery  Medical Nutrition Therapy:  Appt start time: 6:00pm end time:  7:00pm  Primary concerns today: Post-operative Bariatric Surgery Nutrition Management 6 Month Post-Op Class  Post-Operative RYGB Surgery Surgery Date: 02/02/2019  Anthropometrics  Start weight at [facility]: 283.5 lbs (date: 11/28/2018) Today's weight: 202.4 lbs    Information Reviewed/ Discussed During Appointment: -Review of composition scale numbers -Fluid requirements (64-100 ounces) -Protein requirements (60-80g) -Strategies for tolerating diet -Advancement of diet to include Starchy vegetables -Barriers to inclusion of new foods -Inclusion of appropriate multivitamin and calcium supplements  -Exercise recommendations   Fluid intake: adequate   Medications: See List Supplementation: appropriate   Using straws: no Drinking while eating: no Having you been chewing well: yes Chewing/swallowing difficulties: no Changes in vision: no Changes to mood/headaches: no Hair loss/C[place]nges to skin/Changes to nails: no Any difficulty focusing or concentrating: no Sweating: no Dizziness/Lightheaded: no Palpitations: no  Carbonated beverages: no N/V/D/C/GAS: no Abdominal Pain: no Dumping syndrome: no  Recent physical activity:  ADL's  Progress Towards Goal(s):  In Progress  Handouts given during visit include:  Phase V diet Progression   Goals Sheet  The Benefits of Exercise are endless.....  Support Group Topics  Pt Chosen Goals:  Mental Health and Well Being:  I will conduct (specific number) __1______ stress reducing activities a week by (specific date) _________10_______.  Name the Activities: _____swim_____________  ______kayak______________          ___________________  ____________________  Physical Activity Goals: I will (type of movement) ______wts___________________ for (hours/minutes) ___30_______, for (how many days a week)  ______3____________________ by (specific date) _____10___________  Teaching Method Utilized:  Visual Auditory Hands on  Demonstrated degree of understanding via:  Teach Back   Monitoring/Evaluation:  Dietary intake, exercise, and body weight. Follow up in 3 months for 9 month post-op visit.

## 2019-10-23 ENCOUNTER — Ambulatory Visit (INDEPENDENT_AMBULATORY_CARE_PROVIDER_SITE_OTHER): Payer: No Typology Code available for payment source | Admitting: Obstetrics & Gynecology

## 2019-10-23 ENCOUNTER — Encounter: Payer: Self-pay | Admitting: Obstetrics & Gynecology

## 2019-10-23 ENCOUNTER — Other Ambulatory Visit: Payer: Self-pay

## 2019-10-23 VITALS — BP 126/87 | HR 62 | Ht 65.0 in | Wt 189.0 lb

## 2019-10-23 DIAGNOSIS — Z3046 Encounter for surveillance of implantable subdermal contraceptive: Secondary | ICD-10-CM

## 2019-10-23 NOTE — Progress Notes (Signed)
Procedure note: removal The right arm is prepped in usual sterile fashion Received 3 cc of half percent Marcaine is injected at the site of the Nexplanon insertion The Nexplanon is palpated without difficulty A small stab incision is made with an 11 blade Curved hemostats were used and the Nexplanon rod was removed without difficulty  Florian Buff, MD 10/23/2019 11:49 AM

## 2019-11-03 ENCOUNTER — Other Ambulatory Visit: Payer: Self-pay

## 2019-11-03 ENCOUNTER — Encounter: Payer: No Typology Code available for payment source | Attending: Surgery | Admitting: Dietician

## 2019-11-03 ENCOUNTER — Encounter: Payer: Self-pay | Admitting: Dietician

## 2019-11-03 DIAGNOSIS — Z9884 Bariatric surgery status: Secondary | ICD-10-CM | POA: Diagnosis not present

## 2019-11-03 NOTE — Progress Notes (Signed)
Bariatric Nutrition Follow-Up Visit Medical Nutrition Therapy   9 Months Post-Operative RYGB Surgery Surgery Date: 02/02/2019  Pt's Expectations of Surgery/ Goals: to lose and maintain weight loss Pt Reported Successes: feels better, more energy, more confidence/self love, no issues    NUTRITION ASSESSMENT  Anthropometrics  Start weight at [facility]: 283.5 lbs (date: 11/28/2018) Today's weight: 188.6 lbs  Body Composition Scale 04/01/19 08/04/19 11/03/19  Weight  lbs 235 202.4 188.6  BMI 39 33.7 31  Total Body Fat  % - - 35.3     Visceral Fat - - 8  Fat-Free Mass  % - - 64.6     Total Body Water  % - - 46.8     Muscle-Mass  lbs - - 33.2  Body Fat Displacement --- --- ---         Torso  lbs - - 41.1         Left Leg  lbs - - 8.2         Right Leg  lbs - - 8.2         Left Arm  lbs - - 4.1         Right Arm  lbs - - 4.1    Lifestyle & Dietary Hx Patient states she has had no recent issues with tolerating foods/fluids, chewing, bowel movements, and states hair loss has gotten much better. Continues to drink plenty of bariatric appropriate fluids and meets protein goal daily. States she does lots of walking and is incorporating jogging now.    24-Hr Dietary Recall First Meal: Jimmy Dean's scrambled cup (or protein shake)  Snack: laughing cow cheese + crackers  Second Meal: Healthy Choice meal (meat + veggie + potatoes)  Snack: [place] yogurt + Kind bar    Third Meal: [place] chicken + green beans + potato Snack: - Beverages: water, Crystal Light, Powerade Zero   Estimated daily fluid intake: 64+ oz Estimated daily protein intake: 60+ g Supplements: Bariatric Advantage MVI, calcium citrate  Current average weekly physical activity: walking, jogging    Post-Op Goals/ Signs/ Symptoms Using straws: no Drinking while eating: no Chewing/swallowing difficulties: no Changes in vision: no Changes to mood/headaches: no Hair loss/changes to skin/nails: no Difficulty  focusing/concentrating: no Sweating: no Dizziness/lightheadedness: no Palpitations: no  Carbonated/caffeinated beverages: no N/V/D/C/Gas: no Abdominal pain: no Dumping syndrome: no   NUTRITION DIAGNOSIS  Overweight/obesity ([place]-3.3) related to past poor dietary habits and physical inactivity as evidenced by completed bariatric surgery and following dietary guidelines for continued weight loss and healthy nutrition status.   NUTRITION INTERVENTION Nutrition counseling (C-1) and education (E-2) to facilitate bariatric surgery goals, including: . Diet advancement to the next phase (phase 6) now including fruit . The importance of consuming adequate calories as well as certain nutrients daily due to the body's need for essential vitamins, minerals, and fats . The importance of daily physical activity and to reach a goal of at least 150 minutes of moderate to vigorous physical activity weekly (or as directed by their physician) due to benefits such as increased musculature and improved lab values  Handouts Provided Include   Phase 6: Protein + Non-Starchy Vegetables + Fruit   Learning Style & Readiness for Change Teaching method utilized: Visual & Auditory  Demonstrated degree of understanding via: Teach Back  Barriers to learning/adherence to lifestyle change: None Identified    MONITORING & EVALUATION Dietary intake, weekly physical activity, body weight, and goals in 3 months .  Next Steps Patient is to follow-up  in 3 months for 12 month post-op follow-up.

## 2019-11-03 NOTE — Patient Instructions (Signed)
•   Continue to aim for a minimum of 64 fluid ounces daily with at least 32 ounces being plain water. . Eat non-starchy vegetables 2 times a day 7 days a week. . Per meal/snack, eat 2-3 ounces of protein first. o Add in fruit throughout the day which you may substitute starchy vegetables with (fruits and starchy vegetables are both great sources of complex carbohydrates.) o Eat fruits as long as you are able to meet your daily protein goal and still get in non-starchy vegetables 2 times per day. Whatever else you have room for may be fruit and/or starchy vegetables (potatoes, sweet potatoes, corn, and peas.)  . Continue to aim for 30 minutes of physical activity at least 5 times a week. . Remember to take 3 calcium's plus your bariatric multivitamin DAILY.  

## 2020-01-20 ENCOUNTER — Encounter: Payer: Self-pay | Admitting: Adult Health

## 2020-01-20 ENCOUNTER — Other Ambulatory Visit: Payer: Self-pay

## 2020-01-20 ENCOUNTER — Other Ambulatory Visit (HOSPITAL_COMMUNITY): Payer: Self-pay | Admitting: Adult Health

## 2020-01-20 ENCOUNTER — Ambulatory Visit: Payer: No Typology Code available for payment source | Admitting: Adult Health

## 2020-01-20 VITALS — BP 133/86 | HR 66 | Ht 65.0 in | Wt 186.0 lb

## 2020-01-20 DIAGNOSIS — N930 Postcoital and contact bleeding: Secondary | ICD-10-CM

## 2020-01-20 DIAGNOSIS — Z975 Presence of (intrauterine) contraceptive device: Secondary | ICD-10-CM | POA: Diagnosis not present

## 2020-01-20 DIAGNOSIS — Z3202 Encounter for pregnancy test, result negative: Secondary | ICD-10-CM | POA: Insufficient documentation

## 2020-01-20 LAB — POCT URINE PREGNANCY: Preg Test, Ur: NEGATIVE

## 2020-01-20 MED ORDER — METRONIDAZOLE 0.75 % VA GEL: 1.0000 | Freq: Every day | VAGINAL | 1 refills | Status: DC

## 2020-01-20 MED FILL — metroNIDAZOLE 0.75 % GEL: 0.75 | 5 days supply | Qty: 70 | Fill #0

## 2020-01-20 NOTE — Progress Notes (Signed)
  Subjective:     Patient ID: Mary Pham, female   DOB: 1986/01/21, 34 y.o.   MRN: [id number]  HPI She is a 34 year old white female,married, G1P1 in complaining of bleeding after sex, has mirena IUD. She had gastric by pass 02/02/19 and has lost 120 lbs. PCP is Dr Dimas Aguas.  Review of Systems +bleeding after sex Some cramping last night after sex Reviewed past medical,surgical, social and family history. Reviewed medications and allergies.     Objective:   Physical Exam BP 133/86 (BP Location: Left Arm, Patient Position: Sitting, Cuff Size: Normal)   Pulse 66   Ht 5\' 5"  (1.651 m)   Wt 186 lb (84.4 kg)   BMI 30.95 kg/m    UPT negative. Skin warm and dry.Pelvic: external genitalia is normal in appearance no lesions, vagina: pink,urethra has no lesions or masses noted, cervix: bulbous,+IUD strings,+spotting when touched with big Q tip, uterus: normal size, shape and contour, non tender, no masses felt, adnexa: no masses or tenderness noted. Bladder is non tender and no masses felt.  Fall risk is low  Upstream - 01/20/20 1051      Pregnancy Intention Screening   Does the patient want to become pregnant in the next year? Unsure    Does the patient's partner want to become pregnant in the next year? Unsure    Would the patient like to discuss contraceptive options today? No      Contraception Wrap Up   Current Method IUD or IUS    End Method IUD or IUS    Contraception Counseling Provided No         Examination chaperoned by 01/22/20 LPN.  Assessment:     1. Pregnancy examination or test, negative result  2. Postcoital and contact bleeding Will rx Metrogel,use for 5 nights and no sex, can use 1 applicator after sex if needed  Meds ordered this encounter  Medications  . metroNIDAZOLE (METROGEL VAGINAL) 0.75 % vaginal gel    Sig: Place 1 Applicatorful vaginally at bedtime.    Dispense:  70 g    Refill:  1    Order Specific Question:   Supervising Provider    Answer:    EURE, LUTHER H [[id number]]    3. IUD (intrauterine device) in place Placed 03/27/19 by Dr 05/27/19    Plan:     Follow up prn, if persists will get GYN Despina Hidden

## 2020-01-27 ENCOUNTER — Ambulatory Visit: Payer: No Typology Code available for payment source | Admitting: Dermatology

## 2020-01-29 MED FILL — metroNIDAZOLE 0.75 % GEL: 0.75 | 5 days supply | Qty: 70 | Fill #0

## 2020-02-09 ENCOUNTER — Encounter: Payer: No Typology Code available for payment source | Attending: Surgery | Admitting: Skilled Nursing Facility1

## 2020-02-16 ENCOUNTER — Ambulatory Visit: Payer: No Typology Code available for payment source | Admitting: Family Medicine

## 2020-03-07 NOTE — Patient Instructions (Signed)
DUE TO COVID-19 ONLY ONE VISITOR IS ALLOWED TO COME WITH YOU AND STAY IN THE WAITING ROOM ONLY DURING PRE OP AND PROCEDURE DAY OF SURGERY. THE 1 VISITOR  MAY VISIT WITH YOU AFTER SURGERY IN YOUR PRIVATE ROOM DURING VISITING HOURS ONLY!  YOU NEED TO HAVE A COVID 19 TEST ON: 03/12/20@ 11:00 AM, THIS TEST MUST BE DONE BEFORE SURGERY,  COVID TESTING SITE [place] [address], IT IS ON THE RIGHT GOING OUT [address] APPROXIMATELY  2 MINUTES PAST [employer] ON THE RIGHT. ONCE YOUR COVID TEST IS COMPLETED,  PLEASE BEGIN THE QUARANTINE INSTRUCTIONS AS OUTLINED IN YOUR HANDOUT.                Mary Pham   Your procedure is scheduled on: 03/15/20   Report to [facility] Main  Entrance   Report to admitting at: 11:15 AM     Call this number if you have problems the morning of surgery [phone]    Remember: Do not eat solid food :After Midnight. Clear liquids until: 10:15 am.  CLEAR LIQUID DIET  Foods Allowed                                                                     Foods Excluded  Coffee and tea, regular and decaf                             liquids that you cannot  Plain Jell-O any favor except red or purple                                           see through such as: Fruit ices (not with fruit pulp)                                     milk, soups, orange juice  Iced Popsicles                                    All solid food Carbonated beverages, regular and diet                                    Cranberry, grape and apple juices Sports drinks like Gatorade Lightly seasoned clear broth or consume(fat free) Sugar, honey syrup  Sample Menu Breakfast                                Lunch                                     Supper Cranberry juice  Beef broth                            Chicken broth Jell-O                                     Grape juice                           Apple juice Coffee or tea                         Jell-O                                      Popsicle                                                Coffee or tea                        Coffee or tea  _____________________________________________________________________  BRUSH YOUR TEETH MORNING OF SURGERY AND RINSE YOUR MOUTH OUT, NO CHEWING GUM CANDY OR MINTS.                               You may not have any metal on your body including hair pins and              piercings  Do not wear jewelry, make-up, lotions, powders or perfumes, deodorant             Do not wear nail polish on your fingernails.  Do not shave  48 hours prior to surgery.    Do not bring valuables to the hospital. [place].  Contacts, dentures or bridgework may not be worn into surgery.  Leave suitcase in the car. After surgery it may be brought to your room.     Patients discharged the day of surgery will not be allowed to drive home. IF YOU ARE HAVING SURGERY AND GOING HOME THE SAME DAY, YOU MUST HAVE AN ADULT TO DRIVE YOU HOME AND BE WITH YOU FOR 24 HOURS. YOU MAY GO HOME BY TAXI OR UBER OR ORTHERWISE, BUT AN ADULT MUST ACCOMPANY YOU HOME AND STAY WITH YOU FOR 24 HOURS.  Name and phone number of your driver:  Special Instructions: N/A              Please read over the following fact sheets you were given: _____________________________________________________________________          [facility] - Preparing for Surgery Before surgery, you can play an important role.  Because skin is not sterile, your skin needs to be as free of germs as possible.  You can reduce the number of germs on your skin by washing with CHG (chlorahexidine gluconate) soap before surgery.  CHG is an antiseptic cleaner which kills germs and bonds with the skin to continue killing germs even after washing.  Please DO NOT use if you have an allergy to CHG or antibacterial soaps.  If your skin becomes reddened/irritated stop using the CHG  and inform your nurse when you arrive at Short Stay. Do not shave (including legs and underarms) for at least 48 hours prior to the first CHG shower.  You may shave your face/neck. Please follow these instructions carefully:  1.  Shower with CHG Soap the night before surgery and the  morning of Surgery.  2.  If you choose to wash your hair, wash your hair first as usual with your  normal  shampoo.  3.  After you shampoo, rinse your hair and body thoroughly to remove the  shampoo.                           4.  Use CHG as you would any other liquid soap.  You can apply chg directly  to the skin and wash                       Gently with a scrungie or clean washcloth.  5.  Apply the CHG Soap to your body ONLY FROM THE NECK DOWN.   Do not use on face/ open                           Wound or open sores. Avoid contact with eyes, ears mouth and genitals (private parts).                       Wash face,  Genitals (private parts) with your normal soap.             6.  Wash thoroughly, paying special attention to the area where your surgery  will be performed.  7.  Thoroughly rinse your body with warm water from the neck down.  8.  DO NOT shower/wash with your normal soap after using and rinsing off  the CHG Soap.                9.  Pat yourself dry with a clean towel.            10.  Wear clean pajamas.            11.  Place clean sheets on your bed the night of your first shower and do not  sleep with pets. Day of Surgery : Do not apply any lotions/deodorants the morning of surgery.  Please wear clean clothes to the hospital/surgery center.  FAILURE TO FOLLOW THESE INSTRUCTIONS MAY RESULT IN THE CANCELLATION OF YOUR SURGERY PATIENT SIGNATURE_________________________________  NURSE SIGNATURE__________________________________  ________________________________________________________________________

## 2020-03-08 ENCOUNTER — Encounter (HOSPITAL_COMMUNITY): Payer: Self-pay

## 2020-03-08 ENCOUNTER — Encounter (HOSPITAL_COMMUNITY)
Admission: RE | Admit: 2020-03-08 | Discharge: 2020-03-08 | Disposition: A | Discharge status: Home / Self Care | Payer: No Typology Code available for payment source | Source: Ambulatory Visit | Attending: Surgery | Admitting: Surgery

## 2020-03-08 ENCOUNTER — Other Ambulatory Visit: Payer: Self-pay

## 2020-03-08 DIAGNOSIS — Z01812 Encounter for preprocedural laboratory examination: Secondary | ICD-10-CM | POA: Insufficient documentation

## 2020-03-08 LAB — CBC
HCT: 46.7 % — ABNORMAL HIGH (ref 36.0–46.0)
Hemoglobin: 15.5 g/dL — ABNORMAL HIGH (ref 12.0–15.0)
MCH: 28.4 pg (ref 26.0–34.0)
MCHC: 33.2 g/dL (ref 30.0–36.0)
MCV: 85.5 fL (ref 80.0–100.0)
Platelets: 185 10*3/uL (ref 150–400)
RBC: 5.46 MIL/uL — ABNORMAL HIGH (ref 3.87–5.11)
RDW: 12.9 % (ref 11.5–15.5)
WBC: 4.8 10*3/uL (ref 4.0–10.5)
nRBC: 0 % (ref 0.0–0.2)

## 2020-03-08 NOTE — Progress Notes (Addendum)
COVID Vaccine Completed: NO Date COVID Vaccine completed: COVID vaccine manufacturer: [place]   PCP - Dr. Rory Percy Cardiologist - Dr. Kate Sable. LOV: 01/29/19  Chest x-ray -  EKG -  Stress Test -  ECHO -  Cardiac Cath -  Pacemaker/ICD device last checked:  Sleep Study -  CPAP -   Fasting Blood Sugar -  Checks Blood Sugar _____ times a day  Blood Thinner Instructions: Aspirin Instructions: Last Dose:  Anesthesia review:   Patient denies shortness of breath, fever, cough and chest pain at PAT appointment   Patient verbalized understanding of instructions that were given to them at the PAT appointment. Patient was also instructed that they will need to review over the PAT instructions again at home before surgery.

## 2020-03-12 ENCOUNTER — Other Ambulatory Visit (HOSPITAL_COMMUNITY)
Admission: RE | Admit: 2020-03-12 | Discharge: 2020-03-12 | Disposition: A | Discharge status: Home / Self Care | Payer: No Typology Code available for payment source | Source: Ambulatory Visit | Attending: Surgery | Admitting: Surgery

## 2020-03-12 DIAGNOSIS — Z20822 Contact with and (suspected) exposure to covid-19: Secondary | ICD-10-CM | POA: Insufficient documentation

## 2020-03-12 DIAGNOSIS — Z01812 Encounter for preprocedural laboratory examination: Secondary | ICD-10-CM | POA: Insufficient documentation

## 2020-03-12 LAB — SARS CORONAVIRUS 2 (TAT 6-24 HRS): SARS Coronavirus 2: NEGATIVE

## 2020-03-13 NOTE — H&P (Signed)
Mary Pham Location: [place] Office Patient #: [id number] DOB: 18-Jul-1985 Married / Language: English / Race: White Female   History of Present Illness Mary Key B. Hassell Done MD; 01/27/2020 3:36 PM) The patient is a 35 year old female presenting status-post bariatric surgery. this is the one year office visit for Mary Pham who had gastric bypass last January and has lost at least 96 lbs. she has done great. She does have problems with intertriginous maceration and also hygiene problems. She uses deodorant daily in the 2 skin folds that are most prominent and has had hidradenitis on the right side that is healed. 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 When she saw him our PA 6 months ago she gave her nystatin cream for this obvious breakdown in these folds.. She is sent in photos which we will have to submit for panniculectomy.  I have discussed panniculectomy with her and her husband. We also discussed avoidance of simple carbohydrate recidivism. I think she looks great and has done wonderfully. I will see her along with her husband who I am seeing regarding his gallbladder.   Allergies Mary Pham, CMA; 01/27/2020 2:56 PM) guaiFENesin-Codeine *COUGH/COLD/ALLERGY*  No Known Drug Allergies  [05/08/2019]: (Marked as Inactive)  Medication History (Mary Pham, CMA; 01/27/2020 2:56 PM) Pantoprazole Sodium (40MG  Tablet DR, Oral) Active. Megestrol Acetate (Oral) Specific strength unknown - Active. Calcium Citrate-Vitamin D (Oral) Specific strength unknown - Active. Nexplanon (68MG  Implant, Subcutaneous) Active. Medications Reconciled  Vitals (Mary Pham CMA; 01/27/2020 2:55 PM) 01/27/2020 2:54 PM Weight: 187.4 lb Height: 65in Body Surface Area: 1.92 m Body Mass Index: 31.18 kg/m  Pulse: 88 (Regular)  BP:  110/78(Sitting, Left Arm, Standard)   Physical Exam  HEENT unremarkable Chest clear Heart SR Abdomen Note: she does have some chronic intertriginous maceration and irritation. On the right side there is a scar from her hidradenitis suppurativa of the was treated. I think that she is probably lost significant amount awaited enough to proceed with panniculectomy if we can get this approved. Approved and marked in holding.  She is OK with my taking umbilicus if needed.    Assessment & Plan  S/P GASTRIC BYPASS (Z98.84) Impression: greater than 100 pound weight loss since her surgery in January 2021. She has significant pannus that hangs down over her genitalia and has had problems with intertriginous maceration. She takes meticulous care of this but still has to apply deodorant into these falls daily to avoid the outer. We'll submit the photos and see if we can get her approved for panniculectomy. Current Plans Abdominal panniculectomy  Call us sooner as needed.  Signed by Pedro Earls, MD (01/27/2020 3:40 PM)

## 2020-03-15 ENCOUNTER — Ambulatory Visit (HOSPITAL_COMMUNITY): Payer: No Typology Code available for payment source | Admitting: Certified Registered Nurse Anesthetist

## 2020-03-15 ENCOUNTER — Ambulatory Visit (HOSPITAL_COMMUNITY)
Admission: RE | Admit: 2020-03-15 | Discharge: 2020-03-16 | Disposition: A | Discharge status: Home / Self Care | Payer: No Typology Code available for payment source | Source: Ambulatory Visit | Attending: Surgery | Admitting: Surgery

## 2020-03-15 ENCOUNTER — Encounter (HOSPITAL_COMMUNITY): Payer: Self-pay | Admitting: Surgery

## 2020-03-15 ENCOUNTER — Encounter (HOSPITAL_COMMUNITY)
Admission: RE | Disposition: A | Discharge status: Home / Self Care | Payer: Self-pay | Source: Ambulatory Visit | Attending: Surgery

## 2020-03-15 DIAGNOSIS — Z9884 Bariatric surgery status: Secondary | ICD-10-CM | POA: Diagnosis not present

## 2020-03-15 DIAGNOSIS — M793 Panniculitis, unspecified: Secondary | ICD-10-CM | POA: Insufficient documentation

## 2020-03-15 DIAGNOSIS — Z9889 Other specified postprocedural states: Secondary | ICD-10-CM

## 2020-03-15 DIAGNOSIS — Z888 Allergy status to other drugs, medicaments and biological substances status: Secondary | ICD-10-CM | POA: Diagnosis not present

## 2020-03-15 DIAGNOSIS — Z885 Allergy status to narcotic agent status: Secondary | ICD-10-CM | POA: Diagnosis not present

## 2020-03-15 HISTORY — PX: PANNICULECTOMY: SHX5360

## 2020-03-15 LAB — CBC
HCT: 42.8 % (ref 36.0–46.0)
Hemoglobin: 14.5 g/dL (ref 12.0–15.0)
MCH: 28.7 pg (ref 26.0–34.0)
MCHC: 33.9 g/dL (ref 30.0–36.0)
MCV: 84.8 fL (ref 80.0–100.0)
Platelets: 147 10*3/uL — ABNORMAL LOW (ref 150–400)
RBC: 5.05 MIL/uL (ref 3.87–5.11)
RDW: 13.1 % (ref 11.5–15.5)
WBC: 8.9 10*3/uL (ref 4.0–10.5)
nRBC: 0 % (ref 0.0–0.2)

## 2020-03-15 LAB — CREATININE, SERUM
Creatinine, Ser: 0.65 mg/dL (ref 0.44–1.00)
GFR, Estimated: >60 mL/min (ref >60)

## 2020-03-15 LAB — PREGNANCY, URINE: Preg Test, Ur: NEGATIVE

## 2020-03-15 LAB — HEMOGLOBIN AND HEMATOCRIT, BLOOD
HCT: 41.5 % (ref 36.0–46.0)
Hemoglobin: 13.8 g/dL (ref 12.0–15.0)

## 2020-03-15 SURGERY — PANNICULECTOMY
Anesthesia: General

## 2020-03-15 MED ORDER — BARIATRIC FUSION PO CHEW: CHEWABLE_TABLET | Freq: Every day | ORAL | Status: DC

## 2020-03-15 MED ORDER — SCOPOLAMINE 1 MG/3DAYS TD PT72
1.0000 | MEDICATED_PATCH | TRANSDERMAL | Status: DC
  Administered 2020-03-15: 1.5 mg via TRANSDERMAL

## 2020-03-15 MED ORDER — OXYCODONE HCL 5 MG/5ML PO SOLN
ORAL | Status: AC
  Administered 2020-03-15: 5 mg
  Filled 2020-03-15: qty 5

## 2020-03-15 MED ORDER — KETAMINE HCL 10 MG/ML IJ SOLN
INTRAMUSCULAR | Status: DC | PRN
  Administered 2020-03-15: 40 mg via INTRAVENOUS

## 2020-03-15 MED ORDER — LACTATED RINGERS IV SOLN: INTRAVENOUS | Status: DC

## 2020-03-15 MED ORDER — ONDANSETRON HCL 4 MG/2ML IJ SOLN
INTRAMUSCULAR | Status: DC | PRN
  Administered 2020-03-15: 4 mg via INTRAVENOUS

## 2020-03-15 MED ORDER — 0.9 % SODIUM CHLORIDE (POUR BTL) OPTIME
TOPICAL | Status: DC | PRN
  Administered 2020-03-15: 1000 mL

## 2020-03-15 MED ORDER — METOPROLOL TARTRATE 5 MG/5ML IV SOLN
5.0000 mg | Freq: Four times a day (QID) | INTRAVENOUS | Status: DC | PRN
  Filled 2020-03-15: qty 5

## 2020-03-15 MED ORDER — KCL IN DEXTROSE-NACL 20-5-0.45 MEQ/L-%-% IV SOLN
INTRAVENOUS | Status: AC
  Filled 2020-03-15: qty 1000

## 2020-03-15 MED ORDER — FENTANYL CITRATE (PF) 100 MCG/2ML IJ SOLN
25.0000 ug | INTRAMUSCULAR | Status: DC | PRN
  Administered 2020-03-15 (×2): 50 ug via INTRAVENOUS

## 2020-03-15 MED ORDER — ACETAMINOPHEN 160 MG/5ML PO SOLN: 1000.0000 mg | Freq: Three times a day (TID) | ORAL | Status: DC

## 2020-03-15 MED ORDER — CEFAZOLIN SODIUM-DEXTROSE 2-4 GM/100ML-% IV SOLN
INTRAVENOUS | Status: AC
  Filled 2020-03-15: qty 100

## 2020-03-15 MED ORDER — CEFAZOLIN SODIUM-DEXTROSE 2-4 GM/100ML-% IV SOLN
2.0000 g | INTRAVENOUS | Status: AC
  Administered 2020-03-15: 2 g via INTRAVENOUS

## 2020-03-15 MED ORDER — ADULT MULTIVITAMIN W/MINERALS CH
1.0000 | ORAL_TABLET | Freq: Every day | ORAL | Status: DC
  Administered 2020-03-16: 1 via ORAL
  Filled 2020-03-15: qty 1

## 2020-03-15 MED ORDER — SCOPOLAMINE 1 MG/3DAYS TD PT72
MEDICATED_PATCH | TRANSDERMAL | Status: AC
  Filled 2020-03-15: qty 1

## 2020-03-15 MED ORDER — CHLORHEXIDINE GLUCONATE CLOTH 2 % EX PADS: 6.0000 | MEDICATED_PAD | Freq: Once | CUTANEOUS | Status: DC

## 2020-03-15 MED ORDER — FENTANYL CITRATE (PF) 250 MCG/5ML IJ SOLN
INTRAMUSCULAR | Status: AC
  Filled 2020-03-15: qty 5

## 2020-03-15 MED ORDER — METRONIDAZOLE 0.75 % VA GEL: 1.0000 | Freq: Every day | VAGINAL | Status: DC

## 2020-03-15 MED ORDER — HEPARIN SODIUM (PORCINE) 5000 UNIT/ML IJ SOLN
5000.0000 [IU] | Freq: Three times a day (TID) | INTRAMUSCULAR | Status: DC
  Administered 2020-03-16: 5000 [IU] via SUBCUTANEOUS
  Filled 2020-03-15: qty 1

## 2020-03-15 MED ORDER — ACETAMINOPHEN 500 MG PO TABS
1000.0000 mg | ORAL_TABLET | Freq: Three times a day (TID) | ORAL | Status: DC
  Administered 2020-03-15 – 2020-03-16 (×2): 1000 mg via ORAL
  Filled 2020-03-15 (×2): qty 2

## 2020-03-15 MED ORDER — KCL IN DEXTROSE-NACL 20-5-0.45 MEQ/L-%-% IV SOLN: INTRAVENOUS | Status: DC

## 2020-03-15 MED ORDER — MORPHINE SULFATE (PF) 2 MG/ML IV SOLN
1.0000 mg | INTRAVENOUS | Status: DC | PRN
Start: 2020-03-15 — End: 2020-03-16
  Filled 2020-03-15: qty 1

## 2020-03-15 MED ORDER — MIDAZOLAM HCL 2 MG/2ML IJ SOLN
INTRAMUSCULAR | Status: DC | PRN
  Administered 2020-03-15: 2 mg via INTRAVENOUS

## 2020-03-15 MED ORDER — CALCIUM-VITAMIN D-VITAMIN K 500-1000-40 MG-UNT-MCG PO CHEW: CHEWABLE_TABLET | Freq: Three times a day (TID) | ORAL | Status: DC

## 2020-03-15 MED ORDER — PROPOFOL 10 MG/ML IV BOLUS
INTRAVENOUS | Status: DC | PRN
  Administered 2020-03-15: 140 mg via INTRAVENOUS

## 2020-03-15 MED ORDER — CALCIUM CITRATE 950 (200 CA) MG PO TABS
200.0000 mg | ORAL_TABLET | Freq: Three times a day (TID) | ORAL | Status: DC
  Administered 2020-03-16: 200 mg via ORAL
  Filled 2020-03-15 (×2): qty 1

## 2020-03-15 MED ORDER — OXYCODONE HCL 5 MG/5ML PO SOLN
5.0000 mg | Freq: Once | ORAL | Status: AC | PRN
  Administered 2020-03-15: 5 mg via ORAL

## 2020-03-15 MED ORDER — CHLORHEXIDINE GLUCONATE 0.12 % MT SOLN
15.0000 mL | Freq: Once | OROMUCOSAL | Status: AC
  Administered 2020-03-15: 15 mL via OROMUCOSAL

## 2020-03-15 MED ORDER — ONDANSETRON HCL 4 MG/2ML IJ SOLN: 4.0000 mg | Freq: Once | INTRAMUSCULAR | Status: DC | PRN

## 2020-03-15 MED ORDER — DEXAMETHASONE SODIUM PHOSPHATE 10 MG/ML IJ SOLN
INTRAMUSCULAR | Status: DC | PRN
  Administered 2020-03-15: 5 mg via INTRAVENOUS

## 2020-03-15 MED ORDER — VITAMIN D 25 MCG (1000 UNIT) PO TABS
1000.0000 [IU] | ORAL_TABLET | Freq: Three times a day (TID) | ORAL | Status: DC
  Administered 2020-03-16 (×2): 1000 [IU] via ORAL
  Filled 2020-03-15 (×2): qty 1

## 2020-03-15 MED ORDER — OXYCODONE HCL 5 MG/5ML PO SOLN
5.0000 mg | Freq: Four times a day (QID) | ORAL | Status: DC | PRN
  Administered 2020-03-15 – 2020-03-16 (×3): 5 mg via ORAL
  Filled 2020-03-15 (×3): qty 5

## 2020-03-15 MED ORDER — FENTANYL CITRATE (PF) 100 MCG/2ML IJ SOLN
INTRAMUSCULAR | Status: AC
  Filled 2020-03-15: qty 2

## 2020-03-15 MED ORDER — HEPARIN SODIUM (PORCINE) 5000 UNIT/ML IJ SOLN: 5000.0000 [IU] | Freq: Once | INTRAMUSCULAR | Status: AC

## 2020-03-15 MED ORDER — LIDOCAINE 2% (20 MG/ML) 5 ML SYRINGE
INTRAMUSCULAR | Status: DC | PRN
  Administered 2020-03-15: 1.5 mg/kg/h via INTRAVENOUS

## 2020-03-15 MED ORDER — SUGAMMADEX SODIUM 200 MG/2ML IV SOLN
INTRAVENOUS | Status: DC | PRN
  Administered 2020-03-15: 200 mg via INTRAVENOUS

## 2020-03-15 MED ORDER — ONDANSETRON HCL 4 MG/2ML IJ SOLN: 4.0000 mg | INTRAMUSCULAR | Status: DC | PRN

## 2020-03-15 MED ORDER — ROCURONIUM BROMIDE 10 MG/ML (PF) SYRINGE
PREFILLED_SYRINGE | INTRAVENOUS | Status: DC | PRN
  Administered 2020-03-15: 60 mg via INTRAVENOUS
  Administered 2020-03-15: 20 mg via INTRAVENOUS

## 2020-03-15 MED ORDER — LIDOCAINE HCL 2 % IJ SOLN
INTRAMUSCULAR | Status: AC
  Filled 2020-03-15: qty 20

## 2020-03-15 MED ORDER — ACETAMINOPHEN 500 MG PO TABS
1000.0000 mg | ORAL_TABLET | Freq: Four times a day (QID) | ORAL | Status: DC | PRN
  Administered 2020-03-16: 1000 mg via ORAL
  Filled 2020-03-15: qty 2

## 2020-03-15 MED ORDER — KETAMINE HCL 10 MG/ML IJ SOLN
INTRAMUSCULAR | Status: AC
  Filled 2020-03-15: qty 1

## 2020-03-15 MED ORDER — OXYCODONE HCL 5 MG PO TABS: 5.0000 mg | ORAL_TABLET | Freq: Once | ORAL | Status: AC | PRN

## 2020-03-15 MED ORDER — HEPARIN SODIUM (PORCINE) 5000 UNIT/ML IJ SOLN
INTRAMUSCULAR | Status: AC
  Administered 2020-03-15: 5000 [IU] via SUBCUTANEOUS
  Filled 2020-03-15: qty 1

## 2020-03-15 MED ORDER — MIDAZOLAM HCL 2 MG/2ML IJ SOLN
INTRAMUSCULAR | Status: AC
  Filled 2020-03-15: qty 2

## 2020-03-15 MED ORDER — LIDOCAINE HCL (CARDIAC) PF 100 MG/5ML IV SOSY
PREFILLED_SYRINGE | INTRAVENOUS | Status: DC | PRN
  Administered 2020-03-15: 60 mg via INTRAVENOUS

## 2020-03-15 MED ORDER — PANTOPRAZOLE SODIUM 40 MG IV SOLR
40.0000 mg | Freq: Every day | INTRAVENOUS | Status: DC
  Administered 2020-03-15: 40 mg via INTRAVENOUS
  Filled 2020-03-15: qty 40

## 2020-03-15 MED ORDER — LEVONORGESTREL 20 MCG/24HR IU IUD: 1.0000 | INTRAUTERINE_SYSTEM | Freq: Once | INTRAUTERINE | Status: DC

## 2020-03-15 MED ORDER — ORAL CARE MOUTH RINSE: 15.0000 mL | Freq: Once | OROMUCOSAL | Status: AC

## 2020-03-15 MED ORDER — ROCURONIUM BROMIDE 10 MG/ML (PF) SYRINGE
PREFILLED_SYRINGE | INTRAVENOUS | Status: AC
  Filled 2020-03-15: qty 10

## 2020-03-15 MED ORDER — PHENYLEPHRINE 40 MCG/ML (10ML) SYRINGE FOR IV PUSH (FOR BLOOD PRESSURE SUPPORT)
PREFILLED_SYRINGE | INTRAVENOUS | Status: AC
  Filled 2020-03-15: qty 10

## 2020-03-15 MED ORDER — ENSURE MAX PROTEIN PO LIQD
2.0000 [oz_av] | ORAL | Status: DC
  Administered 2020-03-16: 2 [oz_av] via ORAL

## 2020-03-15 MED ORDER — FENTANYL CITRATE (PF) 250 MCG/5ML IJ SOLN
INTRAMUSCULAR | Status: DC | PRN
  Administered 2020-03-15: 50 ug via INTRAVENOUS
  Administered 2020-03-15: 150 ug via INTRAVENOUS
  Administered 2020-03-15: 100 ug via INTRAVENOUS
  Administered 2020-03-15: 50 ug via INTRAVENOUS

## 2020-03-15 SURGICAL SUPPLY — 35 items
APPLICATOR COTTON TIP 6 STRL (MISCELLANEOUS) ×1 IMPLANT
APPLICATOR COTTON TIP 6IN STRL (MISCELLANEOUS) ×2
BLADE EXTENDED COATED 6.5IN (ELECTRODE) IMPLANT
BLADE SURG SZ10 CARB STEEL (BLADE) IMPLANT
COVER SURGICAL LIGHT HANDLE (MISCELLANEOUS) ×2 IMPLANT
COVER WAND RF STERILE (DRAPES) IMPLANT
DRAPE WARM FLUID 44X44 (DRAPES) ×2 IMPLANT
DRESSING PREVENA PLUS CUSTOM (GAUZE/BANDAGES/DRESSINGS) ×1 IMPLANT
DRSG PREVENA PLUS CUSTOM (GAUZE/BANDAGES/DRESSINGS) ×2
ELECT REM PT RETURN 15FT ADLT (MISCELLANEOUS) ×2 IMPLANT
GAUZE SPONGE 4X4 12PLY STRL (GAUZE/BANDAGES/DRESSINGS) ×2 IMPLANT
GLOVE BIOGEL M 8.0 STRL (GLOVE) ×2 IMPLANT
GLOVE SURG UNDER POLY LF SZ7 (GLOVE) ×2 IMPLANT
GOWN SPEC L4 XLG W/TWL (GOWN DISPOSABLE) ×2 IMPLANT
GOWN STRL REUS W/TWL LRG LVL3 (GOWN DISPOSABLE) ×2 IMPLANT
GOWN STRL REUS W/TWL XL LVL3 (GOWN DISPOSABLE) ×6 IMPLANT
HANDLE SUCTION POOLE (INSTRUMENTS) IMPLANT
KIT BASIN OR (CUSTOM PROCEDURE TRAY) ×2 IMPLANT
KIT DRSG PREVENA PLUS 7DAY 125 (MISCELLANEOUS) ×2 IMPLANT
KIT TURNOVER KIT A (KITS) ×2 IMPLANT
LIGASURE IMPACT 36 18CM CVD LR (INSTRUMENTS) IMPLANT
NS IRRIG 1000ML POUR BTL (IV SOLUTION) ×4 IMPLANT
PACK GENERAL/GYN (CUSTOM PROCEDURE TRAY) ×2 IMPLANT
PACK UNIVERSAL I (CUSTOM PROCEDURE TRAY) ×2 IMPLANT
PENCIL SMOKE EVACUATOR (MISCELLANEOUS) ×2 IMPLANT
SPONGE LAP 18X18 RF (DISPOSABLE) ×6 IMPLANT
STAPLER VISISTAT 35W (STAPLE) ×2 IMPLANT
SUCTION POOLE HANDLE (INSTRUMENTS)
SUT ETHILON 3 0 PS 1 (SUTURE) ×22 IMPLANT
SUT SILK 2 0 (SUTURE) ×1
SUT SILK 2-0 18XBRD TIE 12 (SUTURE) ×1 IMPLANT
SUT VIC AB 3-0 SH 18 (SUTURE) ×8 IMPLANT
SUT VIC AB 4-0 SH 18 (SUTURE) ×8 IMPLANT
TOWEL OR 17X26 10 PK STRL BLUE (TOWEL DISPOSABLE) ×4 IMPLANT
TRAY FOLEY MTR SLVR 16FR STAT (SET/KITS/TRAYS/PACK) ×2 IMPLANT

## 2020-03-15 NOTE — Interval H&P Note (Signed)
History and Physical Interval Note:  03/15/2020 2:21 PM  Mary Pham  has presented today for surgery, with the diagnosis of PANNICULITIS.  The various methods of treatment have been discussed with the patient and family. After consideration of risks, benefits and other options for treatment, the patient has consented to  Procedure(s) with comments: ABDOMINAL PANNICULECTOMY (N/A) - ROOM 3 STARTING AT 01:15PM FOR 90 MIN as a surgical intervention.  The patient's history has been reviewed, patient examined, no change in status, stable for surgery.  I have reviewed the patient's chart and labs.  Questions were answered to the patient's satisfaction.     Pedro Earls

## 2020-03-15 NOTE — Op Note (Signed)
Mary Pham  1985/04/24   03/15/2020    PCP:  Caryl Bis, MD   Surgeon: Kaylyn Lim, MD, FACS  Asst:  Greer Pickerel, MD, FACS  Anes:  General  Preop Dx: Post greater than 100 pound weight loss after bariatric surgery with panniculitis Postop Dx: Same  Procedure: Abdominal panniculectomy-1900 g Location Surgery: [place] long or 1 Complications: None  EBL:   15 cc  Drains: No drains but the [place] wound incisional VAC  Description of Procedure:  The patient was taken to OR 1.  After anesthesia was administered and the patient was prepped  with Hibiclens and a timeout was performed.  I did previously marked the patient in the standing position and noted her umbilicus was puckered down and we talked about possibly removing it.  I marked her with a sharpie.  Once she had been prepped and draped I incised markings above and below and came to the dermis with the Bovie.  We used the LigaSure to transect the fat and vessels and got down to the fascia and we took this off.  The weight was 1900 g.  We mobilized the the superior flap and and saw where she had had a previous umbilical hernia repair with some woven broth and braided suture like Surgidek.  The umbilical stalk was freed from the fascia and we did not seem to create another hernia.  This could this gave the superior flap mobility.  We also mobilized the mons where she had a previous C-section and scarring.  We irrigated.  Bleeding seem to be controlled.  We closed in multiple layers with multiple 3-0 Vicryl's sutures and and ultimately with vertical mattresses of 3-0 nylon on the skin.  The [place] wound incisional VAC was placed.  The patient was awakened taken the recovery room in satisfactory condition.  The patient tolerated the procedure well and was taken to the PACU in stable condition.     [name] Done, [place], [facility], [place]

## 2020-03-15 NOTE — Anesthesia Procedure Notes (Signed)
Procedure Name: Intubation Date/Time: 03/15/2020 4:13 PM Performed by: Raenette Rover, CRNA Pre-anesthesia Checklist: Patient identified, Emergency Drugs available, Suction available and Patient being monitored Patient Re-evaluated:Patient Re-evaluated prior to induction Oxygen Delivery Method: Circle system utilized Preoxygenation: Pre-oxygenation with 100% oxygen Induction Type: IV induction Ventilation: Mask ventilation without difficulty Laryngoscope Size: Mac and 3 Grade View: Grade I Tube type: Oral Tube size: 7.0 mm Number of attempts: 1 Airway Equipment and Method: Stylet Placement Confirmation: ETT inserted through vocal cords under direct vision,  positive ETCO2 and breath sounds checked- equal and bilateral Secured at: 21 cm Tube secured with: Tape Dental Injury: Teeth and Oropharynx as per pre-operative assessment

## 2020-03-15 NOTE — Anesthesia Preprocedure Evaluation (Signed)
Anesthesia Evaluation  Patient identified by MRN, date of birth, ID band Patient awake    Reviewed: Allergy & Precautions, NPO status , Patient's Chart, lab work & pertinent test results  Airway Mallampati: II  TM Distance: >3 FB Neck ROM: Full    Dental no notable dental hx. (+) Teeth Intact   Pulmonary neg pulmonary ROS,    Pulmonary exam normal breath sounds clear to auscultation       Cardiovascular hypertension, Pt. on medications Normal cardiovascular exam Rhythm:Regular Rate:Normal     Neuro/Psych  Headaches, negative psych ROS   GI/Hepatic Neg liver ROS, IBS Hx/o gastric bypass   Endo/Other  Obesity Abdominal pannus  Renal/GU negative Renal ROS  negative genitourinary   Musculoskeletal negative musculoskeletal ROS (+)   Abdominal (+) + obese,   Peds  Hematology negative hematology ROS (+)   Anesthesia Other Findings   Reproductive/Obstetrics negative OB ROS                             Anesthesia Physical Anesthesia Plan  ASA: II  Anesthesia Plan: General   Post-op Pain Management:    Induction: Intravenous  PONV Risk Score and Plan: 4 or greater and Scopolamine patch - Pre-op, Midazolam, Dexamethasone, Ondansetron and Treatment may vary due to age or medical condition  Airway Management Planned: LMA and Oral ETT  Additional Equipment:   Intra-op Plan:   Post-operative Plan: Extubation in OR  Informed Consent: I have reviewed the patients History and Physical, chart, labs and discussed the procedure including the risks, benefits and alternatives for the proposed anesthesia with the patient or authorized representative who has indicated his/her understanding and acceptance.     Dental advisory given  Plan Discussed with: CRNA, Anesthesiologist and Surgeon  Anesthesia Plan Comments:         Anesthesia Quick Evaluation

## 2020-03-15 NOTE — Anesthesia Postprocedure Evaluation (Signed)
Anesthesia Post Note  Patient: Mary Pham  Procedure(s) Performed: ABDOMINAL PANNICULECTOMY (N/A )     Patient location during evaluation: PACU Anesthesia Type: General Level of consciousness: awake and alert and oriented Pain management: pain level controlled Vital Signs Assessment: post-procedure vital signs reviewed and stable Respiratory status: spontaneous breathing, nonlabored ventilation and respiratory function stable Cardiovascular status: blood pressure returned to baseline and stable Postop Assessment: no apparent nausea or vomiting Anesthetic complications: no   No complications documented.  Last Vitals:  Vitals:   03/15/20 1945 03/15/20 2000  BP: 111/63 121/65  Pulse: (!) 57 63  Resp: 12 13  Temp: 36.6 C   SpO2: 100% 100%    Last Pain:  Vitals:   03/15/20 2000  TempSrc:   PainSc: Asleep                 Asahd Can A.

## 2020-03-15 NOTE — Transfer of Care (Signed)
Immediate Anesthesia Transfer of Care Note  Patient: Mary Pham  Procedure(s) Performed: ABDOMINAL PANNICULECTOMY (N/A )  Patient Location: PACU  Anesthesia Type:General  Level of Consciousness: awake, alert , oriented, drowsy and patient cooperative  Airway & Oxygen Therapy: Patient Spontanous Breathing and Patient connected to face mask oxygen  Post-op Assessment: Report given to RN and Post -op Vital signs reviewed and stable  Post vital signs: Reviewed and stable  Last Vitals:  Vitals Value Taken Time  BP 126/78 03/15/20 1902  Temp    Pulse 92 03/15/20 1905  Resp 9 03/15/20 1905  SpO2 100 % 03/15/20 1905  Vitals shown include unvalidated device data.  Last Pain:  Vitals:   03/15/20 1341  TempSrc:   PainSc: 0-No pain         Complications: No complications documented.

## 2020-03-16 ENCOUNTER — Encounter (HOSPITAL_COMMUNITY): Payer: Self-pay | Admitting: Surgery

## 2020-03-16 ENCOUNTER — Other Ambulatory Visit (HOSPITAL_COMMUNITY): Payer: Self-pay | Admitting: Surgery

## 2020-03-16 ENCOUNTER — Other Ambulatory Visit: Payer: Self-pay

## 2020-03-16 DIAGNOSIS — M793 Panniculitis, unspecified: Secondary | ICD-10-CM | POA: Diagnosis not present

## 2020-03-16 LAB — CBC WITH DIFFERENTIAL/PLATELET
Abs Immature Granulocytes: 0.03 10*3/uL (ref 0.00–0.07)
Basophils Absolute: 0 10*3/uL (ref 0.0–0.1)
Basophils Relative: 0 %
Eosinophils Absolute: 0 10*3/uL (ref 0.0–0.5)
Eosinophils Relative: 0 %
HCT: 38.4 % (ref 36.0–46.0)
Hemoglobin: 12.9 g/dL (ref 12.0–15.0)
Immature Granulocytes: 0 %
Lymphocytes Relative: 8 %
Lymphs Abs: 0.9 10*3/uL (ref 0.7–4.0)
MCH: 28.5 pg (ref 26.0–34.0)
MCHC: 33.6 g/dL (ref 30.0–36.0)
MCV: 85 fL (ref 80.0–100.0)
Monocytes Absolute: 0.7 10*3/uL (ref 0.1–1.0)
Monocytes Relative: 6 %
Neutro Abs: 9.6 10*3/uL — ABNORMAL HIGH (ref 1.7–7.7)
Neutrophils Relative %: 86 %
Platelets: 143 10*3/uL — ABNORMAL LOW (ref 150–400)
RBC: 4.52 MIL/uL (ref 3.87–5.11)
RDW: 12.8 % (ref 11.5–15.5)
WBC: 11.2 10*3/uL — ABNORMAL HIGH (ref 4.0–10.5)
nRBC: 0 % (ref 0.0–0.2)

## 2020-03-16 MED ORDER — OXYCODONE HCL 5 MG PO TABS: 5.0000 mg | ORAL_TABLET | Freq: Four times a day (QID) | ORAL | 0 refills | Status: DC | PRN

## 2020-03-16 MED FILL — oxyCODONE HCL 5 MG TABS: 5 | 6 days supply | Qty: 25 | Fill #0

## 2020-03-16 NOTE — Progress Notes (Signed)
Pt alert and oriented.  Attached new Provena wound vac to pt. D/C instructions given. Pt d/cd to home.

## 2020-03-17 ENCOUNTER — Other Ambulatory Visit: Payer: Self-pay | Admitting: *Deleted

## 2020-03-17 LAB — SURGICAL PATHOLOGY

## 2020-03-17 NOTE — Patient Outreach (Signed)
[facility]) Care Management  03/17/2020  Mary Pham 1985-12-27 [id number]   Transition of care call/case closure   Referral received: 03/16/20 Initial outreach:03/07/20 Insurance: [place] [employer]   Subjective: Initial successful telephone call to patient's preferred number in order to complete transition of care assessment; 2 HIPAA identifiers verified. Explained purpose of call and completed transition of care assessment.  Mary Pham states that she is doing good, denies post-operative problems, says surgical incisions are unremarkable, reports having Provena wound vac dressing in place, she voiced concern regarding little  drainage from device and has followed up with surgeon office regarding this  She reports no redness, drainage, increase swelling or signs of infection at site. She reports wearing her abdominal binder. She states surgical pain well managed with prescribed medications. She  tolerating diet,having some occasional nausea, states that she is still wearing patch behind ear from surgery for nausea states it last for 3 days. Reinforced to notify MD of concerns if ongoing nausea. She denies difficulty with urinating , reports no bowel movement yet, has been passing gas, she has mirilax on hand at home and begin dose on today.  Spouse is  assisting with her recovery.   Reviewed accessing the following [place] Benefits :  She does not have the hospital indemnity she has filed for [employer] and on shortterm disablilty.  She uses a [facility] outpatient pharmacy at [place].     Objective:  Per electronic record Mary Pham  was hospitalized at  [facility]  2/22-2/23/22 for Abdominal Panniculectomy  Comorbidities include: Gastric Bypass 02/02/19 She was discharged to home on 03/16/20 without the need for home health services or DME.   Assessment:  Patient voices good understanding of all discharge instructions.  See transition of care  flowsheet for assessment details.   Plan:  Reviewed hospital discharge diagnosis of Abdominal Panniculectomy   and discharge treatment plan using hospital discharge instructions, assessing medication adherence, reviewing problems requiring provider notification, and discussing the importance of follow up with surgeon, primary care provider and/or specialists as directed.  Reviewed [place] healthy lifestyle program information to receive discounted premium for  2023   Step 1: Get  your annual physical  Step 2: Complete your health assessment  Step 3:Identify your current health status and complete the corresponding action step between January 23, 2020 and September 22, 2020.        No ongoing care management needs identified so will close case to [facility] [facility] services and route successful outreach letter with [place] Management pamphlet and 24 Hour Nurse Line Magnet to [facility] [facility] clinical pool to be mailed to patient's home address.    Joylene Draft, RN, BSN  [place] Management Coordinator  [phone]- Mobile [phone]- Toll Free Main Office .

## 2020-04-22 ENCOUNTER — Ambulatory Visit: Admit: 2020-04-22 | Payer: No Typology Code available for payment source | Admitting: Surgery

## 2020-04-22 SURGERY — PANNICULECTOMY
Anesthesia: General

## 2020-05-02 ENCOUNTER — Ambulatory Visit: Payer: No Typology Code available for payment source | Admitting: Plastic Surgery

## 2020-05-02 ENCOUNTER — Encounter: Payer: Self-pay | Admitting: Plastic Surgery

## 2020-05-02 ENCOUNTER — Other Ambulatory Visit: Payer: Self-pay

## 2020-05-02 ENCOUNTER — Institutional Professional Consult (permissible substitution): Payer: Self-pay | Admitting: Plastic Surgery

## 2020-05-02 VITALS — BP 133/82 | HR 62 | Ht 65.0 in | Wt 180.2 lb

## 2020-05-02 DIAGNOSIS — M793 Panniculitis, unspecified: Secondary | ICD-10-CM | POA: Diagnosis not present

## 2020-05-02 NOTE — Progress Notes (Signed)
Referring Provider Caryl Bis, MD [place],  [place] [id number]   CC:  Chief Complaint  Patient presents with  . Advice Only      Mary Pham is an 35 y.o. female.  HPI: Patient presents to discuss her outcome from her recent panniculectomy procedure.  She had a laparoscopic gastric bypass done sometime ago and lost over 100 pounds.  She recently underwent a panniculectomy here locally but has been unhappy with the outcome.  She feels as though she has quite a bit of excess skin superior to the umbilicus.  She also feels that her residual umbilicus is to the right of midline.  She wants to see if anything could be done to correct these concerns.  Previous abdominal operations include laparoscopic cholecystectomy and C-section  Allergies  Allergen Reactions  . Guaifenesin-Codeine     Stomach cramping    Outpatient Encounter Medications as of 05/02/2020  Medication Sig  . acetaminophen (TYLENOL) 500 MG tablet Take 1,000 mg by mouth every 6 (six) hours as needed for moderate pain or headache.  . Calcium Citrate-Vitamin D (CALCIUM + D PO) Take 1 tablet by mouth 3 (three) times daily.  Marland Kitchen levonorgestrel (MIRENA) 20 MCG/24HR IUD 1 each by Intrauterine route once.  . Multiple Vitamins-Minerals (BARIATRIC FUSION PO) Take 1 tablet by mouth daily. "bariatric advantage"  . [DISCONTINUED] metroNIDAZOLE (METROGEL VAGINAL) 0.75 % vaginal gel Place 1 Applicatorful vaginally at bedtime. (Patient not taking: Reported on 03/04/2020)  . [DISCONTINUED] oxyCODONE (OXY IR/ROXICODONE) 5 MG immediate release tablet Take 1 tablet (5 mg total) by mouth every 6 (six) hours as needed for severe pain.   No facility-administered encounter medications on file as of 05/02/2020.     Past Medical History:  Diagnosis Date  . Contraceptive education 10/09/2012  . Hidradenitis suppurativa   . IBS (irritable bowel syndrome)   . Irregular bleeding 10/09/2012  . Migraines    during pregnancy  . Pregnancy  induced hypertension    during pregnancy  7 yeasrs ago    Past Surgical History:  Procedure Laterality Date  . CESAREAN SECTION  02/01/2012   Procedure: CESAREAN SECTION;  Surgeon: Guss Bunde, MD;  Location: [facility];  Service: Obstetrics;  Laterality: N/A;  Primary Cesarean Section Delivery Baby Boy @ 0221, Apgars 7/9  . CHOLECYSTECTOMY    . GASTRIC ROUX-EN-Y N/A 02/02/2019   Procedure: LAPAROSCOPIC ROUX-EN-Y GASTRIC BYPASS WITH UPPER ENDOSCOPY, Upper Endo-ERAS Pathway;  Surgeon: Johnathan Hausen, MD;  Location: [facility];  Service: General;  Laterality: N/A;  . nexplanon implant  12/11/2018   in the office  . PANNICULECTOMY N/A 03/15/2020   Procedure: ABDOMINAL PANNICULECTOMY;  Surgeon: Johnathan Hausen, MD;  Location: [facility];  Service: General;  Laterality: N/A;  ROOM 3 STARTING AT 01:15PM FOR 3 MIN    Family History  Problem Relation Age of Onset  . Hypertension Father   . Diabetes Father   . Heart disease Maternal Grandmother   . Heart disease Maternal Grandfather   . Cancer Paternal Grandmother        lung  . Stroke Paternal Grandfather     Social History   Social History Narrative  . Not on file     Review of Systems General: Denies fevers, chills, weight loss CV: Denies chest pain, shortness of breath, palpitations  Physical Exam Vitals with BMI 05/02/2020 03/16/2020 03/16/2020  Height 5\' 5"  - -  Weight 180 lbs 3 oz - -  BMI 29.99 - -  Systolic 160 109 323  Diastolic 82 68 69  Pulse 62 60 57    General:  No acute distress,  Alert and oriented, Non-Toxic, Normal speech and affect Abdomen: Abdomen is soft nontender.  I do not appreciate any hernias.  She has a lower transverse incision consistent with a infraumbilical panniculectomy.  There is a bit of swelling in the mons area and this is slightly asymmetric being more swollen on the right side centrally in the area of her incision.  Her umbilicus does seem to be to the right of midline by 1 to 2 cm.  She still has  moderate excess skin in the supraumbilical area.  Assessment/Plan Patient presents with concerns regarding her surgical outcome from a panniculectomy performed about 6 weeks ago.  She is bothered by the excess upper abdominal skin and the location of the residual umbilicus.  As I read the operative note it seems like the umbilical stalk was divided at the level of the fascia in order to advance the superior panniculectomy flap inferiorly.  I believe the swelling to the right of midline in the area of her incision may have been a small seroma as she said it did leak fluid for some time but has since stopped.  I explained the difference between abdominoplasty and panniculectomy as I see it.  In my view the correction operation for her would essentially be a abdominoplasty with abdominal wall plication, liposuction of the upper abdominal flap and suprapubic areas, and excision of residual skin.  It may be difficult to salvage her umbilicus but I believe a new one could be fashioned by tacking a lap down to the fascia which should ultimately heal in a similar appearance to an umbilicus.  We discussed the risks of the procedure that include bleeding, infection, damage to surrounding structures and need for additional procedures.  I would expect to use a drain if we were to revise this.  All of her questions were answered and we will plan to move forward and work through this with her.  [name] 05/02/2020, 4:35 PM

## 2020-05-22 HISTORY — PX: OTHER SURGICAL HISTORY: SHX169

## 2020-05-25 ENCOUNTER — Ambulatory Visit: Payer: No Typology Code available for payment source | Admitting: Plastic Surgery

## 2020-05-26 ENCOUNTER — Institutional Professional Consult (permissible substitution): Payer: Self-pay | Admitting: Plastic Surgery

## 2020-05-27 ENCOUNTER — Other Ambulatory Visit: Payer: Self-pay

## 2020-05-27 ENCOUNTER — Ambulatory Visit: Payer: No Typology Code available for payment source | Admitting: Plastic Surgery

## 2020-05-27 DIAGNOSIS — Z411 Encounter for cosmetic surgery: Secondary | ICD-10-CM

## 2020-05-27 NOTE — Progress Notes (Signed)
Patient presents with some additional questions regarding her upcoming surgery.  We are planning a revision of her panniculectomy with upper abdominal liposuction in a few weeks.  She was curious if the fat that was aspirated from her abdomen could be processed and used to augment her breasts.  She feels that her left breast is larger and slightly lower than her right and is interested in what could be done to correct that in addition to the upper pole hollowing which has bothered her as its progressed over time.  On breast exam she is probably around a B or C cup in terms of volume.  The left side is slightly larger.  The nipple areolar complex is lower by approximately 2 cm on the left compared to the right.  There are no obvious scars or masses.  I did have her use our inserts to see what sort of volume she was hoping for.  She seemed most interested in around 75 cc of additional volume.  I do think I could get a result like that with fat grafting.  I would focus it in the upper pole and put more on the right side than on the left to try to balance out her asymmetry.  I explained the details of the fat grafting and how it is done.  I explained the fat would be infiltrated through small incisions.  I did explain that the initial postoperative result would look fuller than the final postoperative result as not all the fat will develop a blood supply and take.  She is understanding of this and is appreciative of the modest improvement that would be achieved through this technique.  I did briefly discuss implants and mastopexy with her for a more involved procedure that would generate a more powerful change but this did not seem to be a priority for her and prefers to stick with the fat grafting.  We will send her a quote for adding this to the upcoming procedure.  All her questions were answered.

## 2020-06-08 ENCOUNTER — Other Ambulatory Visit: Payer: Self-pay

## 2020-06-08 ENCOUNTER — Ambulatory Visit (INDEPENDENT_AMBULATORY_CARE_PROVIDER_SITE_OTHER): Payer: No Typology Code available for payment source

## 2020-06-08 VITALS — BP 120/78 | HR 63 | Ht 65.0 in | Wt 171.0 lb

## 2020-06-08 DIAGNOSIS — Z719 Counseling, unspecified: Secondary | ICD-10-CM

## 2020-06-08 DIAGNOSIS — Z411 Encounter for cosmetic surgery: Secondary | ICD-10-CM

## 2020-06-14 ENCOUNTER — Telehealth: Payer: Self-pay

## 2020-06-14 ENCOUNTER — Other Ambulatory Visit: Payer: Self-pay | Admitting: Surgical

## 2020-06-14 ENCOUNTER — Other Ambulatory Visit (HOSPITAL_COMMUNITY): Payer: Self-pay

## 2020-06-14 MED ORDER — ONDANSETRON HCL 4 MG PO TABS
4.0000 mg | ORAL_TABLET | Freq: Three times a day (TID) | ORAL | 0 refills | Status: DC | PRN
  Filled 2020-06-14: qty 20, 7d supply, fill #0

## 2020-06-14 MED ORDER — OXYCODONE HCL 5 MG PO TABS
5.0000 mg | ORAL_TABLET | Freq: Four times a day (QID) | ORAL | 0 refills | Status: AC | PRN
  Filled 2020-06-14: qty 20, 5d supply, fill #0

## 2020-06-14 NOTE — Telephone Encounter (Signed)
Called and spoke with the patient and informed her that her post-op medications have been sent to [facility].   Patient verbalized understanding and agreed.//AB/CMA

## 2020-06-14 NOTE — Progress Notes (Signed)
Post op meds 

## 2020-06-14 NOTE — Telephone Encounter (Signed)
Patient called to see when her postop medications will be sent to the pharmacy. She has requested they be sent to [facility] outpatient pharmacy.

## 2020-06-16 NOTE — Patient Instructions (Signed)
Pt will call for any changes or concerns We reviewed the post-op instructions & she was given a copy.

## 2020-06-16 NOTE — Progress Notes (Signed)
Patient ID: Mary Pham, female    DOB: [birth year], 35 y.o.   MRN: [id number]  Chief Complaint  Patient presents with  . Pre-op Exam    No diagnosis found.  Cosmetic procedure   History of Present Illness: Mary Pham is a  .  She presents for preoperative evaluation for upcoming procedure: Revision of panniculectomy with upper abdominal liposuction.   scheduled for 06/17/20 with Dr. Claudia Desanctis. Previous surgery-03/15/20  The patient has not had problems with anesthesia  Summary of Previous Visit:  Postop visit with Dr. Claudia Desanctis & consult for cosmetic procedure/revision    PMH Significant for:  Weight loss/ gastric bypass- "roux-en-y" 02/02/2019     Past Medical History: Allergies: Allergies  Allergen Reactions  . Guaifenesin-Codeine     Stomach cramping    Current Medications:  Current Outpatient Medications:  .  Calcium Citrate-Vitamin D (CALCIUM + D PO), Take 1 tablet by mouth 3 (three) times daily., Disp: , Rfl:  .  levonorgestrel (MIRENA) 20 MCG/24HR IUD, 1 each by Intrauterine route once., Disp: , Rfl:  .  Multiple Vitamins-Minerals (BARIATRIC FUSION PO), Take 1 tablet by mouth daily. "bariatric advantage", Disp: , Rfl:  .  acetaminophen (TYLENOL) 500 MG tablet, Take 1,000 mg by mouth every 6 (six) hours as needed for moderate pain or headache. (Patient not taking: Reported on 06/08/2020), Disp: , Rfl:  .  metroNIDAZOLE (METROGEL) 0.75 % vaginal gel, PLACE 1 APPLICATORFUL VAGINALLY AT BEDTIME., Disp: 70 g, Rfl: 1 .  ondansetron (ZOFRAN) 4 MG tablet, Take 1 tablet (4 mg total) by mouth every 8 (eight) hours as needed for nausea or vomiting., Disp: 20 tablet, Rfl: 0 .  oxyCODONE (OXY IR/ROXICODONE) 5 MG immediate release tablet, Take 1 tablet (5 mg total) by mouth every 6 (six) hours as needed for up to 5 days., Disp: 20 tablet, Rfl: 0  Past Medical Problems: Past Medical History:  Diagnosis Date  . Contraceptive education 10/09/2012  . Hidradenitis suppurativa   . IBS  (irritable bowel syndrome)   . Irregular bleeding 10/09/2012  . Migraines    during pregnancy  . Pregnancy induced hypertension    during pregnancy  7 yeasrs ago    Past Surgical History: Past Surgical History:  Procedure Laterality Date  . CESAREAN SECTION  02/01/2012   Procedure: CESAREAN SECTION;  Surgeon: Guss Bunde, MD;  Location: [facility];  Service: Obstetrics;  Laterality: N/A;  Primary Cesarean Section Delivery Baby Boy @ 0221, Apgars 7/9  . CHOLECYSTECTOMY    . GASTRIC ROUX-EN-Y N/A 02/02/2019   Procedure: LAPAROSCOPIC ROUX-EN-Y GASTRIC BYPASS WITH UPPER ENDOSCOPY, Upper Endo-ERAS Pathway;  Surgeon: Johnathan Hausen, MD;  Location: [facility];  Service: General;  Laterality: N/A;  . nexplanon implant  12/11/2018   in the office  . PANNICULECTOMY N/A 03/15/2020   Procedure: ABDOMINAL PANNICULECTOMY;  Surgeon: Johnathan Hausen, MD;  Location: [facility];  Service: General;  Laterality: N/A;  ROOM 3 STARTING AT 01:15PM FOR 90 MIN    Social History: Social History   Socioeconomic History  . Marital status: Married    Spouse name: Not on file  . Number of children: Not on file  . Years of education: Not on file  . Highest education level: Not on file  Occupational History  . Not on file  Tobacco Use  . Smoking status: Never Smoker  . Smokeless tobacco: Never Used  Vaping Use  . Vaping Use: Never used  Substance and Sexual Activity  . Alcohol  use: No  . Drug use: No  . Sexual activity: Yes    Birth control/protection: I.U.D.  Other Topics Concern  . Not on file  Social History Narrative  . Not on file   Social Determinants of Health   Financial Resource Strain: Not on file  Food Insecurity: Not on file  Transportation Needs: Not on file  Physical Activity: Not on file  Stress: Not on file  Social Connections: Not on file  Intimate Partner Violence: Not on file    Family History: Family History  Problem Relation Age of Onset  . Hypertension Father   . Diabetes  Father   . Heart disease Maternal Grandmother   . Heart disease Maternal Grandfather   . Cancer Paternal Grandmother        lung  . Stroke Paternal Grandfather     Review of Systems: ROS  Slight swelling- left foot/anklle- which she states happens with standing/walking / no other signs of cardiac involvement-  pedal pulses are intact & equal- bilaterally EKG- was performed prior to her gastric surgery- negative for any cardiac complications No Hx DVT/varicose veins or claudication. Denies asthma/sleep apnea or other respiratory symptoms No Hx of DM Does have Hx of irritable bowel symptoms   Physical Exam: Vital Signs BP 120/78 (BP Location: Left Arm, Patient Position: Sitting, Cuff Size: Large)   Pulse 63   Ht 5\' 5"  (1.651 m)   Wt 171 lb (77.6 kg)   SpO2 100%   BMI 28.46 kg/m   Physical Exam Constitutional:      General: Not in acute distress.    Appearance: Normal appearance. Not ill-appearing.  HENT:     Head: Normocephalic and atraumatic.  Eyes:     Pupils: Pupils are equal, round Neck:     Musculoskeletal: Normal range of motion.  Cardiovascular:     Rate and Rhythm: Normal rate    Pulses: Normal pulses.  Pulmonary:     Effort: Pulmonary effort is normal. No respiratory distress.  Abdominal:     General: Abdomen is flat. There is no distension.  Musculoskeletal: Normal range of motion.  Skin:    General: Skin is warm and dry.     Findings: No erythema or rash.  Neurological:     General: No focal deficit present.     Mental Status: Alert and oriented to person, place, and time. Mental status is at baseline.     Motor: No weakness.  Psychiatric:        Mood and Affect: Mood normal.        Behavior: Behavior normal.    Assessment/Plan: The patient is scheduled for 06/17/20 with Dr. Claudia Desanctis.  Risks, benefits, and alternatives of procedure discussed, questions answered by myself & Dr. Claudia Desanctis. signed consent obtained- copy entered into chart    Smoking Status:  does not smoke; Counseling Given? n/a Last Mammogram: n/a  Results: n/a  Caprini Score: 5; Risk Factors include:  IBS, , BMI = 29.9, and length of planned surgery. Recommendation for mechanical SCD/  Encourage early ambulation.   Pictures obtained: yes-  Post-op Rx sent to pharmacy: yes- by Matt,PA  Patient was provided with the  General Surgical Risk consent document and Pain Medication Agreement prior to their appointment.  They had adequate time to read through the risk consent documents and Pain Medication Agreement. We also discussed them in person together during this preop appointment. All of their questions were answered to their satisfaction.  Recommended calling if they have any  further questions.  Risk consent form and Pain Medication Agreement to be scanned into patient's chart.     Electronically signed by: [place], RN 06/16/2020 11:53 AM

## 2020-06-17 ENCOUNTER — Other Ambulatory Visit: Payer: Self-pay | Admitting: Plastic Surgery

## 2020-06-17 ENCOUNTER — Other Ambulatory Visit (HOSPITAL_COMMUNITY): Payer: Self-pay

## 2020-06-17 DIAGNOSIS — M793 Panniculitis, unspecified: Secondary | ICD-10-CM | POA: Diagnosis not present

## 2020-06-17 MED ORDER — METHOCARBAMOL 500 MG PO TABS
500.0000 mg | ORAL_TABLET | Freq: Three times a day (TID) | ORAL | 0 refills | Status: DC | PRN
  Filled 2020-06-17: qty 35, 12d supply, fill #0

## 2020-06-21 ENCOUNTER — Telehealth: Payer: Self-pay | Admitting: Plastic Surgery

## 2020-06-21 NOTE — Telephone Encounter (Signed)
Called and spoke with the patient regarding the message below.  Patient stated that she went ahead and took a shower.  She took everything off except the steri strips.    Informed the patient that she's to leave the steri strips on.  She can place the soap on her shoulders and let the water run down.  Patient agreed.  Patient stated that she will be running out of pain meds, and she wanted to know if she can get more of the pain meds.  Informed the patient that she can alternate between Ibuprofen and Tylenol.  She stated that she's not able to take Ibuprofen.    Informed the patient when she comes in on (Thursday-06/23/20) she can spoke with Dr. Claudia Desanctis about what else she can take.  Patient verbalized understanding and agreed.//AB/CMA

## 2020-06-21 NOTE — Telephone Encounter (Signed)
Patient lvm from yesterday to advise that she was taking her shower but had lots of bandages and tape and she wanted to know should she take all the bandages off and shower and then put them back on. Please call to advise steps for showering after surgery.

## 2020-06-23 ENCOUNTER — Encounter: Payer: Self-pay | Admitting: Plastic Surgery

## 2020-06-23 ENCOUNTER — Other Ambulatory Visit: Payer: Self-pay

## 2020-06-23 ENCOUNTER — Ambulatory Visit (INDEPENDENT_AMBULATORY_CARE_PROVIDER_SITE_OTHER): Payer: No Typology Code available for payment source | Admitting: Plastic Surgery

## 2020-06-23 DIAGNOSIS — M793 Panniculitis, unspecified: Secondary | ICD-10-CM

## 2020-06-23 NOTE — Progress Notes (Signed)
Patient is here about 1 week postop from revision panniculectomy, liposuction and umbilicoplasty.  She overall feels good.  The right drain is putting out less than 10 cc a day so it was removed.  The left drain is somewhere between 30 and 40/day so it was left in place.  Her skin all looks healthy and the incision line looks to be doing well.  Umbilicoplasty is maintaining its shape.  We will plan to see her next week to evaluate removal of the second drain.  Of asked her to continue to wear compressive garments.  All of her questions were answered.

## 2020-06-29 ENCOUNTER — Other Ambulatory Visit: Payer: Self-pay

## 2020-06-29 ENCOUNTER — Ambulatory Visit (INDEPENDENT_AMBULATORY_CARE_PROVIDER_SITE_OTHER): Payer: No Typology Code available for payment source

## 2020-06-29 VITALS — BP 118/76 | HR 72 | Temp 98.1°F | Ht 65.0 in | Wt 169.0 lb

## 2020-06-29 DIAGNOSIS — M793 Panniculitis, unspecified: Secondary | ICD-10-CM

## 2020-07-03 NOTE — Progress Notes (Signed)
06/29/20 Patient in office for post-op f/u visit.     She is 2 weeks post-op for revision of abdominoplasty/panniculectomy.     Her incisions appear intact & there are no visible complications.   She denies any chills/fever, no N/V.  Normal bowel/bladder functions.  Drainage has been 8-45ml / day.  There is no visible fluid /seroma indication.  Swelling is equal left to right.  Pain is 4/10- but she confirmed that the pain has decreased significantly.   She is wearing compression garment.   Per Dr. Keane Scrape last summary the remaining drain can be removed.  I removed the drain & covered the opening with vaseline/gauze/tape.   Patient understands that the drain site may continue to drain until healed.  She will continue to use compression garment & she will call for any changes/concerns.

## 2020-07-03 NOTE — Patient Instructions (Signed)
She will continue compression & will call for any concerns.

## 2020-07-27 ENCOUNTER — Ambulatory Visit (INDEPENDENT_AMBULATORY_CARE_PROVIDER_SITE_OTHER): Payer: No Typology Code available for payment source | Admitting: Plastic Surgery

## 2020-07-27 ENCOUNTER — Other Ambulatory Visit: Payer: Self-pay

## 2020-07-27 DIAGNOSIS — Z411 Encounter for cosmetic surgery: Secondary | ICD-10-CM

## 2020-07-27 NOTE — Progress Notes (Signed)
Patient presents for postop visit about 6 weeks out from revision of her panniculectomy with abdominal wall plication and liposuction.  She is overall happy with her shape.  She has some concerns about the umbilicus but otherwise is happy.  On exam she has a well-healed scar that looks to be doing fine.  There is some very mild wrinkling at the most lateral aspect of the incision on both sides that may require a revision down the line.  The umbilicus is dented in but there is a depression that extends vertically superiorly and inferiorly from the needle umbilicus which creates a slightly unusual contour.  This may partially be due to the fact that I did need to create an entirely new umbilicus after the native umbilical stalk was divided at her initial panniculectomy.  The contour and shape overall are quite good.  I will plan to see her in 3 to 4 months to look at the corners of her incision to see if any revision would be required in that area.  She did want to revisit augmentation mastopexy and thigh lift.  She may lose some additional weight and is trying to get things together so that these procedures could be done next and these are her top body contouring priorities.  I had examined her before and she does have a bit more ptosis on the left breast than on the right.  I do think she is a reasonably good candidate for bilateral augmentation.  She may not need a mastopexy on the right side depending on the volume of the implant that she chooses.  For the thighs most of her volume is in the superior third of the thigh.  The lower two thirds actually have a pretty good shape.  There is mild to moderate skin laxity and excess.  I do think that for her the scar can be kept a bit higher and may not have to come all the way down to the knee.  I do think he would be reasonable to do the breast and the thighs at the same time.  We will plan to provide a quote for her for that and discuss it further as time goes on.

## 2020-08-25 ENCOUNTER — Encounter: Payer: Self-pay | Admitting: Physician Assistant

## 2020-08-25 ENCOUNTER — Other Ambulatory Visit (HOSPITAL_COMMUNITY): Payer: Self-pay

## 2020-08-25 ENCOUNTER — Other Ambulatory Visit: Payer: Self-pay

## 2020-08-25 ENCOUNTER — Ambulatory Visit: Payer: No Typology Code available for payment source | Admitting: Physician Assistant

## 2020-08-25 DIAGNOSIS — Z808 Family history of malignant neoplasm of other organs or systems: Secondary | ICD-10-CM | POA: Diagnosis not present

## 2020-08-25 DIAGNOSIS — D225 Melanocytic nevi of trunk: Secondary | ICD-10-CM | POA: Diagnosis not present

## 2020-08-25 DIAGNOSIS — D485 Neoplasm of uncertain behavior of skin: Secondary | ICD-10-CM

## 2020-08-25 DIAGNOSIS — L700 Acne vulgaris: Secondary | ICD-10-CM

## 2020-08-25 DIAGNOSIS — Z1283 Encounter for screening for malignant neoplasm of skin: Secondary | ICD-10-CM

## 2020-08-25 MED ORDER — SPIRONOLACTONE 25 MG PO TABS
50.0000 mg | ORAL_TABLET | Freq: Two times a day (BID) | ORAL | 6 refills | Status: DC
  Filled 2020-08-25: qty 120, 30d supply, fill #0

## 2020-08-25 MED ORDER — WINLEVI 1 % EX CREA: TOPICAL_CREAM | CUTANEOUS | 6 refills | Status: DC

## 2020-08-25 NOTE — Patient Instructions (Signed)

## 2020-08-31 ENCOUNTER — Encounter: Payer: Self-pay | Admitting: Physician Assistant

## 2020-08-31 NOTE — Progress Notes (Signed)
Follow-Up Visit   Subjective  Mary Pham is a 35 y.o. female who presents for the following: Annual Exam (No new concerns- personal history of atypical nevi, but denies any non mole skin cancers.  Patient does have family h/o melanoma ).   The following portions of the chart were reviewed this encounter and updated as appropriate:  Tobacco  Allergies  Meds  Problems  Med Hx  Surg Hx  Fam Hx      Objective  Well appearing patient in no apparent distress; mood and affect are within normal limits.  A full examination was performed including scalp, head, eyes, ears, nose, lips, neck, chest, axillae, abdomen, back, buttocks, bilateral upper extremities, bilateral lower extremities, hands, feet, fingers, toes, fingernails, and toenails. All findings within normal limits unless otherwise noted below.  Right Lower Back Bichromic dark nested macule.          Mid Back Bichromic dark nested macule.        Left Upper Back Bichromic dark nested macule.        Left Abdomen (side) - Lower Bichromic dark nested macule.        Mid Forehead Erythematous papules   Assessment & Plan  Neoplasm of uncertain behavior of skin (4) Right Lower Back  Skin / nail biopsy Type of biopsy: tangential   Informed consent: discussed and consent obtained   Timeout: patient name, date of birth, surgical site, and procedure verified   Procedure prep:  Patient was prepped and draped in usual sterile fashion (Non sterile) Prep type:  Chlorhexidine Anesthesia: the lesion was anesthetized in a standard fashion   Anesthetic:  1% lidocaine w/ epinephrine 1-100,000 local infiltration Instrument used: flexible razor blade   Outcome: patient tolerated procedure well   Post-procedure details: wound care instructions given    Specimen 1 - Surgical pathology Differential Diagnosis: r/o atypia  Check Margins: No  Mid Back  Skin / nail biopsy Type of biopsy: tangential   Informed  consent: discussed and consent obtained   Timeout: patient name, date of birth, surgical site, and procedure verified   Procedure prep:  Patient was prepped and draped in usual sterile fashion (Non sterile) Prep type:  Chlorhexidine Anesthesia: the lesion was anesthetized in a standard fashion   Anesthetic:  1% lidocaine w/ epinephrine 1-100,000 local infiltration Instrument used: flexible razor blade   Outcome: patient tolerated procedure well   Post-procedure details: wound care instructions given    Specimen 2 - Surgical pathology Differential Diagnosis: r/o atypia  Check Margins: No  Left Upper Back  Skin / nail biopsy Type of biopsy: tangential   Informed consent: discussed and consent obtained   Timeout: patient name, date of birth, surgical site, and procedure verified   Procedure prep:  Patient was prepped and draped in usual sterile fashion (Non sterile) Prep type:  Chlorhexidine Anesthesia: the lesion was anesthetized in a standard fashion   Anesthetic:  1% lidocaine w/ epinephrine 1-100,000 local infiltration Instrument used: flexible razor blade   Outcome: patient tolerated procedure well   Post-procedure details: wound care instructions given    Specimen 3 - Surgical pathology Differential Diagnosis: r/o atypia  Check Margins: No  Left Abdomen (side) - Lower  Skin / nail biopsy Type of biopsy: tangential   Informed consent: discussed and consent obtained   Timeout: patient name, date of birth, surgical site, and procedure verified   Procedure prep:  Patient was prepped and draped in usual sterile fashion (Non sterile) Prep type:  Chlorhexidine Anesthesia: the lesion was anesthetized in a standard fashion   Anesthetic:  1% lidocaine w/ epinephrine 1-100,000 local infiltration Instrument used: flexible razor blade   Outcome: patient tolerated procedure well   Post-procedure details: wound care instructions given    Specimen 4 - Surgical  pathology Differential Diagnosis: r/o atypia  Check Margins: No  Acne vulgaris Mid Forehead  spironolactone (ALDACTONE) 25 MG tablet - Mid Forehead Take 2 tablets (50 mg total) by mouth 2 (two) times daily.  Clascoterone (WINLEVI) 1 % CREA - Mid Forehead Apply to acne qhs   I, Keniyah Gelinas, PA-C, have reviewed all documentation's for this visit.  The documentation on 08/31/20 for the exam, diagnosis, procedures and orders are all accurate and complete.

## 2020-09-02 ENCOUNTER — Other Ambulatory Visit (HOSPITAL_COMMUNITY): Payer: Self-pay

## 2020-10-04 ENCOUNTER — Ambulatory Visit (HOSPITAL_BASED_OUTPATIENT_CLINIC_OR_DEPARTMENT_OTHER): Payer: No Typology Code available for payment source | Admitting: Orthopaedic Surgery

## 2020-10-04 ENCOUNTER — Ambulatory Visit (HOSPITAL_BASED_OUTPATIENT_CLINIC_OR_DEPARTMENT_OTHER)
Admission: RE | Admit: 2020-10-04 | Discharge: 2020-10-04 | Disposition: A | Discharge status: Home / Self Care | Payer: No Typology Code available for payment source | Source: Ambulatory Visit | Attending: Orthopaedic Surgery | Admitting: Orthopaedic Surgery

## 2020-10-04 ENCOUNTER — Other Ambulatory Visit: Payer: Self-pay

## 2020-10-04 ENCOUNTER — Other Ambulatory Visit (HOSPITAL_BASED_OUTPATIENT_CLINIC_OR_DEPARTMENT_OTHER): Payer: Self-pay | Admitting: Orthopaedic Surgery

## 2020-10-04 VITALS — BP 147/92 | Ht 65.0 in | Wt 165.0 lb

## 2020-10-04 DIAGNOSIS — G2589 Other specified extrapyramidal and movement disorders: Secondary | ICD-10-CM | POA: Diagnosis not present

## 2020-10-04 DIAGNOSIS — M25512 Pain in left shoulder: Secondary | ICD-10-CM | POA: Diagnosis present

## 2020-10-04 MED ORDER — LIDOCAINE HCL 1 % IJ SOLN
4.0000 mL | INTRAMUSCULAR | Status: AC | PRN
  Administered 2020-10-04: 4 mL

## 2020-10-04 MED ORDER — TRIAMCINOLONE ACETONIDE 40 MG/ML IJ SUSP
80.0000 mg | INTRAMUSCULAR | Status: AC | PRN
  Administered 2020-10-04: 80 mg via INTRA_ARTICULAR

## 2020-10-04 NOTE — Progress Notes (Signed)
Chief Complaint: Left shoulder pain     History of Present Illness:   Pain Score: 8/10 SANE: Mary Pham is a 35 y.o. female with ongoing left shoulder pain and clicking.  She states that this really was aggravated on a trip to [facility] September 2.  She was loading the car and states that she overdid it.  She has been taking Tylenol which does not help.  She has had a massage which also seem to have aggravated the issue.  She states that overall the pain is getting worse.  She endorses clicking and popping about the left shoulder as well.  She has not had any physical therapy or injections.    Surgical History:   None  PMH/PSH/Family History/Social History/Meds/Allergies:    Past Medical History:  Diagnosis Date  . Contraceptive education 10/09/2012  . Hidradenitis suppurativa   . IBS (irritable bowel syndrome)   . Irregular bleeding 10/09/2012  . Migraines    during pregnancy  . Pregnancy induced hypertension    during pregnancy  7 yeasrs ago   Past Surgical History:  Procedure Laterality Date  . CESAREAN SECTION  02/01/2012   Procedure: CESAREAN SECTION;  Surgeon: Guss Bunde, MD;  Location: [facility];  Service: Obstetrics;  Laterality: N/A;  Primary Cesarean Section Delivery Baby Boy @ 0221, Apgars 7/9  . CHOLECYSTECTOMY    . GASTRIC ROUX-EN-Y N/A 02/02/2019   Procedure: LAPAROSCOPIC ROUX-EN-Y GASTRIC BYPASS WITH UPPER ENDOSCOPY, Upper Endo-ERAS Pathway;  Surgeon: Johnathan Hausen, MD;  Location: [facility];  Service: General;  Laterality: N/A;  . nexplanon implant  12/11/2018   in the office  . PANNICULECTOMY N/A 03/15/2020   Procedure: ABDOMINAL PANNICULECTOMY;  Surgeon: Johnathan Hausen, MD;  Location: [facility];  Service: General;  Laterality: N/A;  ROOM 3 STARTING AT 01:15PM FOR 90 MIN   Social History   Socioeconomic History  . Marital status: Married    Spouse name: Not on file  . Number of children: Not on file  .  Years of education: Not on file  . Highest education level: Not on file  Occupational History  . Not on file  Tobacco Use  . Smoking status: Never  . Smokeless tobacco: Never  Vaping Use  . Vaping Use: Never used  Substance and Sexual Activity  . Alcohol use: No  . Drug use: No  . Sexual activity: Yes    Birth control/protection: I.U.D.  Other Topics Concern  . Not on file  Social History Narrative  . Not on file   Social Determinants of Health   Financial Resource Strain: Not on file  Food Insecurity: Not on file  Transportation Needs: Not on file  Physical Activity: Not on file  Stress: Not on file  Social Connections: Not on file   Family History  Problem Relation Age of Onset  . Hypertension Father   . Diabetes Father   . Heart disease Maternal Grandmother   . Heart disease Maternal Grandfather   . Cancer Paternal Grandmother        lung  . Stroke Paternal Grandfather    Allergies  Allergen Reactions  . Guaifenesin-Codeine     Stomach cramping   Current Outpatient Medications  Medication Sig Dispense Refill  . Calcium Citrate-Vitamin D (CALCIUM + D PO) Take 1  tablet by mouth 3 (three) times daily.    . Clascoterone (WINLEVI) 1 % CREA Apply to acne qhs 60 g 6  . levonorgestrel (MIRENA) 20 MCG/24HR IUD 1 each by Intrauterine route once.    . Multiple Vitamins-Minerals (BARIATRIC FUSION PO) Take 1 tablet by mouth daily. "bariatric advantage"    . spironolactone (ALDACTONE) 25 MG tablet Take 2 tablets (50 mg total) by mouth 2 (two) times daily. 60 tablet 6   No current facility-administered medications for this visit.   No results found.  Review of Systems:   A ROS was performed including pertinent positives and negatives as documented in the HPI.  Physical Exam :   Constitutional: NAD and appears stated age Neurological: Alert and oriented Psych: Appropriate affect and cooperative Blood pressure (!) 147/92, height '5\' 5"'$  (1.651 m), weight 165 lb (74.8  kg).   Comprehensive Musculoskeletal Exam:    Musculoskeletal Exam    Inspection Right Left  Skin No atrophy or winging Medially winging  Palpation    Tenderness None Left medial scapula rhomboids lower trapezius  Range of Motion    Flexion (passive) 170 170  Flexion (active) 180 180  Extension 30 30  Abduction 170 170  ER at the side 70 70  ER at 90 of abduction 90 90  IR at 90 of abduction 60 60  Can reach behind back to T10 T10  Strength     Full Full  Special Tests    Pseudoparalytic No No  Neurologic    Fires PIN, radial, median, ulnar, musculocutaneous, axillary, suprascapular, long thoracic, and spinal accessory innervated muscles. No abnormal sensibility  Vascular/Lymphatic    Radial Pulse 2+ 2+  Cervical Exam    Patient has symmetric cervical range of motion with negative Spurling's test.  Special Test: Positive bursal clicking on left underneath scapula     Imaging:   Xray (3 view left shoulder): Normal  I personally reviewed and interpreted the radiographs.   Assessment:   35 year old female with left subscapular bursitis in the setting of scapular dyskinesis.  I have advised that physical therapy for rhomboid and lower trapezius strengthening as well as serratus anterior strengthening will be ideal in order to optimize her posturing and shoulder motion.  Given that she is in significant pain and having difficulty sleeping I would like to also perform a subscapular injection today.  She would like this.  We will see her back as needed should she fail to improve with a strengthening program  Plan :    -Left subscapular injection provided today -Physical therapy ordered for scapular dyskinesis -She will follow-up as needed if fails to improve     Procedure Note  Patient: Mary Pham             Date of Birth: May 30, 1985           MRN: [id number]             Visit Date: 10/04/2020  Procedures: Visit Diagnoses:  1. Scapular dyskinesis     Large  Joint Inj on 10/04/2020 11:02 AM Indications: pain Details: 22 G 1.5 in needle, ultrasound-guided anterior approach  Arthrogram: No  Medications: 4 mL lidocaine 1 %; 80 mg triamcinolone acetonide 40 MG/ML Outcome: tolerated well, no immediate complications  Left subscapular bursa Procedure, treatment alternatives, risks and benefits explained, specific risks discussed. Consent was given by the patient. Immediately prior to procedure a time out was called to verify the correct patient, procedure, equipment, support staff and  site/side marked as required. Patient was prepped and draped in the usual sterile fashion.       I personally saw and evaluated the patient, and participated in the management and treatment plan.  Vanetta Mulders, MD Attending Physician, Orthopedic Surgery  This document was dictated using Dragon voice recognition software. A reasonable attempt at proof reading has been made to minimize errors.

## 2020-10-06 ENCOUNTER — Ambulatory Visit (HOSPITAL_BASED_OUTPATIENT_CLINIC_OR_DEPARTMENT_OTHER): Payer: No Typology Code available for payment source | Admitting: Orthopaedic Surgery

## 2020-10-12 ENCOUNTER — Ambulatory Visit: Payer: No Typology Code available for payment source | Admitting: Plastic Surgery

## 2020-10-12 ENCOUNTER — Other Ambulatory Visit: Payer: Self-pay

## 2020-10-12 ENCOUNTER — Encounter: Payer: Self-pay | Admitting: Plastic Surgery

## 2020-10-12 ENCOUNTER — Ambulatory Visit: Payer: No Typology Code available for payment source | Admitting: Surgical

## 2020-10-12 DIAGNOSIS — L732 Hidradenitis suppurativa: Secondary | ICD-10-CM

## 2020-10-12 DIAGNOSIS — L987 Excessive and redundant skin and subcutaneous tissue: Secondary | ICD-10-CM | POA: Diagnosis not present

## 2020-10-12 NOTE — Progress Notes (Signed)
Referring Provider Caryl Bis, MD [place],  [place] [address]   CC:  Chief Complaint  Patient presents with   Follow-up      Mary Pham is an 35 y.o. female.  HPI: Patient presents to discuss her thigh contour and chronic groin hidradenitis.  She has had pustules and intermittent drainage from the skin in the groin area for years.  She is tried oral antibiotics, biologic medications, spironolactone, isotretinoin, and multiple small surgical excisions and drainage procedures.  She continues to have intermittent symptoms.  She is also bothered by the contour of her thighs after significant weight loss.  She wants to see if anything could be done surgically for a more definitive solution.  Allergies  Allergen Reactions   Guaifenesin-Codeine     Stomach cramping    Outpatient Encounter Medications as of 10/12/2020  Medication Sig   Calcium Citrate-Vitamin D (CALCIUM + D PO) Take 1 tablet by mouth 3 (three) times daily.   Clascoterone (WINLEVI) 1 % CREA Apply to acne qhs   levonorgestrel (MIRENA) 20 MCG/24HR IUD 1 each by Intrauterine route once.   Multiple Vitamins-Minerals (BARIATRIC FUSION PO) Take 1 tablet by mouth daily. "bariatric advantage"   spironolactone (ALDACTONE) 25 MG tablet Take 2 tablets (50 mg total) by mouth 2 (two) times daily.   No facility-administered encounter medications on file as of 10/12/2020.     Past Medical History:  Diagnosis Date   Contraceptive education 10/09/2012   Hidradenitis suppurativa    IBS (irritable bowel syndrome)    Irregular bleeding 10/09/2012   Migraines    during pregnancy   Pregnancy induced hypertension    during pregnancy  7 yeasrs ago    Past Surgical History:  Procedure Laterality Date   CESAREAN SECTION  02/01/2012   Procedure: CESAREAN SECTION;  Surgeon: Guss Bunde, MD;  Location: [facility];  Service: Obstetrics;  Laterality: N/A;  Primary Cesarean Section Delivery Baby Boy @ 0221, Apgars 7/9    CHOLECYSTECTOMY     GASTRIC ROUX-EN-Y N/A 02/02/2019   Procedure: LAPAROSCOPIC ROUX-EN-Y GASTRIC BYPASS WITH UPPER ENDOSCOPY, Upper Endo-ERAS Pathway;  Surgeon: Johnathan Hausen, MD;  [facility];  Service: General;  Laterality: N/A;   nexplanon implant  12/11/2018   in the office   PANNICULECTOMY N/A 03/15/2020   Procedure: ABDOMINAL PANNICULECTOMY;  Surgeon: Johnathan Hausen, MD;  [facility];  Service: General;  Laterality: N/A;  ROOM 3 STARTING AT 01:15PM FOR 47 MIN    Family History  Problem Relation Age of Onset   Hypertension Father    Diabetes Father    Heart disease Maternal Grandmother    Heart disease Maternal Grandfather    Cancer Paternal Grandmother        lung   Stroke Paternal Grandfather     Social History   Social History Narrative   Not on file     Review of Systems General: Denies fevers, chills, weight loss CV: Denies chest pain, shortness of breath, palpitations  Physical Exam Vitals with BMI 10/04/2020 06/29/2020 06/08/2020  Height 5\' 5"  5\' 5"  5\' 5"   Weight 165 lbs 169 lbs 171 lbs  BMI 27.46 16.38 46.65  Systolic 993 570 177  Diastolic 92 76 78  Pulse - 72 63    General:  No acute distress,  Alert and oriented, Non-Toxic, Normal speech and affect Examination shows signs of chronic hidradenitis in the groin creases.  This is fairly quiescent at this point in time.  No obvious surgical scars in that area.  She has upper thigh fullness and skin laxity as result of her weight loss but this is mostly can find to the upper third of the thigh.  Assessment/Plan Patient presents with inguinal hidradenitis in combination with thigh skin laxity from massive weight loss.  I explained that she would be a reasonable candidate for hidradenitis excision given the multiple conservative therapies that she has tried and failed.  In addition at the same time to improve the contour I can liposuction the upper thigh and do a bit more tailoring to the skin to improve the  contour.  We discussed the risks of the procedure that include bleeding, infection, damage to surrounding structures and need for additional procedures.  I explained I would plan the excision around the areas of skin disease.  Liposuction would be done and I would tailor the skin to try to get the contour as best as possible.  I explained the scar would be mostly confined to the groin crease but then might extend down the thigh for short distance depending on how the contour settled out.  She satisfied with this plan and we will move forward with the process.  [name] 10/12/2020, 9:08 AM

## 2020-10-27 ENCOUNTER — Encounter: Payer: Self-pay | Admitting: Plastic Surgery

## 2020-10-27 ENCOUNTER — Other Ambulatory Visit: Payer: Self-pay

## 2020-10-27 ENCOUNTER — Ambulatory Visit (INDEPENDENT_AMBULATORY_CARE_PROVIDER_SITE_OTHER): Payer: No Typology Code available for payment source | Admitting: Plastic Surgery

## 2020-10-27 DIAGNOSIS — Z411 Encounter for cosmetic surgery: Secondary | ICD-10-CM

## 2020-10-27 NOTE — Progress Notes (Signed)
Operative Note   DATE OF OPERATION: 10/27/2020  LOCATION:    SURGICAL DEPARTMENT: Plastic Surgery  PREOPERATIVE DIAGNOSES: Dogear after abdominoplasty  POSTOPERATIVE DIAGNOSES:  same  PROCEDURE:  Excision of right abdomen dogear  SURGEON: Talmadge Coventry, MD  ANESTHESIA:  Local  COMPLICATIONS: None.   INDICATIONS FOR PROCEDURE:  The patient, Mary Pham is a 35 y.o. female born on 02/18/1985, is here for treatment of right abdomen dogear MRN: [id number]  CONSENT:  Informed consent was obtained directly from the patient. Risks, benefits and alternatives were fully discussed. Specific risks including but not limited to bleeding, infection, hematoma, seroma, scarring, pain, infection, wound healing problems, and need for further surgery were all discussed. The patient did have an ample opportunity to have questions answered to satisfaction.   DESCRIPTION OF PROCEDURE:  Local anesthesia was administered. The patient's operative site was prepped and draped in a sterile fashion. A time out was performed and all information was confirmed to be correct.  The lesion was excised with a 15 blade.  Hemostasis was obtained.  Circumferential undermining was performed and the skin was advanced and closed in layers with interrupted buried Monocryl sutures and 4 Monocryl subcuticular for the skin.    The patient tolerated the procedure well.  There were no complications.

## 2020-11-23 ENCOUNTER — Encounter: Payer: Self-pay | Admitting: Plastic Surgery

## 2020-11-23 ENCOUNTER — Ambulatory Visit: Payer: No Typology Code available for payment source | Admitting: Plastic Surgery

## 2020-11-23 ENCOUNTER — Other Ambulatory Visit (HOSPITAL_BASED_OUTPATIENT_CLINIC_OR_DEPARTMENT_OTHER): Payer: Self-pay

## 2020-11-23 ENCOUNTER — Other Ambulatory Visit: Payer: Self-pay

## 2020-11-23 VITALS — BP 146/93 | HR 62 | Ht 65.0 in | Wt 161.4 lb

## 2020-11-23 DIAGNOSIS — L732 Hidradenitis suppurativa: Secondary | ICD-10-CM

## 2020-11-23 MED ORDER — OXYCODONE-ACETAMINOPHEN 5-325 MG PO TABS
1.0000 | ORAL_TABLET | ORAL | 0 refills | Status: AC | PRN
  Filled 2020-11-23: qty 30, 5d supply, fill #0

## 2020-11-23 MED ORDER — ONDANSETRON HCL 4 MG PO TABS
4.0000 mg | ORAL_TABLET | Freq: Three times a day (TID) | ORAL | 0 refills | Status: DC | PRN
  Filled 2020-11-23: qty 5, 2d supply, fill #0

## 2020-11-23 NOTE — Progress Notes (Signed)
Referring Provider Caryl Bis, MD [place],   [address]   CC:  Chief Complaint  Patient presents with   Pre-op Exam      Mary Pham is an 35 y.o. female.  HPI: Patient is here for her preop for upcoming surgical procedure.  This is for bilateral inguinal hidradenitis excision.  She has a number of questions which we will go through today.  Allergies  Allergen Reactions   Guaifenesin-Codeine     Stomach cramping    Outpatient Encounter Medications as of 11/23/2020  Medication Sig   Calcium Citrate-Vitamin D (CALCIUM + D PO) Take 1 tablet by mouth 3 (three) times daily.   Clascoterone (WINLEVI) 1 % CREA Apply to acne qhs   levonorgestrel (MIRENA) 20 MCG/24HR IUD 1 each by Intrauterine route once.   Multiple Vitamins-Minerals (BARIATRIC FUSION PO) Take 1 tablet by mouth daily. "bariatric advantage"   spironolactone (ALDACTONE) 25 MG tablet Take 2 tablets (50 mg total) by mouth 2 (two) times daily.   No facility-administered encounter medications on file as of 11/23/2020.     Past Medical History:  Diagnosis Date   Contraceptive education 10/09/2012   Hidradenitis suppurativa    IBS (irritable bowel syndrome)    Irregular bleeding 10/09/2012   Migraines    during pregnancy   Pregnancy induced hypertension    during pregnancy  7 yeasrs ago    Past Surgical History:  Procedure Laterality Date   CESAREAN SECTION  02/01/2012   Procedure: CESAREAN SECTION;  Surgeon: Guss Bunde, MD;  Location: [facility];  Service: Obstetrics;  Laterality: N/A;  Primary Cesarean Section Delivery Baby Boy @ 0221, Apgars 7/9   CHOLECYSTECTOMY     GASTRIC ROUX-EN-Y N/A 02/02/2019   Procedure: LAPAROSCOPIC ROUX-EN-Y GASTRIC BYPASS WITH UPPER ENDOSCOPY, Upper Endo-ERAS Pathway;  Surgeon: Johnathan Hausen, MD;  Location: [facility];  Service: General;  Laterality: N/A;   nexplanon implant  12/11/2018   in the office   PANNICULECTOMY N/A 03/15/2020   Procedure: ABDOMINAL PANNICULECTOMY;   Surgeon: Johnathan Hausen, MD;  Location: [facility];  Service: General;  Laterality: N/A;  ROOM 3 STARTING AT 01:15PM FOR 63 MIN    Family History  Problem Relation Age of Onset   Hypertension Father    Diabetes Father    Heart disease Maternal Grandmother    Heart disease Maternal Grandfather    Cancer Paternal Grandmother        lung   Stroke Paternal Grandfather     Social History   Social History Narrative   Not on file     Review of Systems General: Denies fevers, chills, weight loss CV: Denies chest pain, shortness of breath, palpitations  Physical Exam Vitals with BMI 11/23/2020 10/04/2020 06/29/2020  Height 5\' 5"  5\' 5"  5\' 5"   Weight 161 lbs 6 oz 165 lbs 169 lbs  BMI 26.86 57.32 20.25  Systolic 427 062 376  Diastolic 93 92 76  Pulse 62 - 72    General:  No acute distress,  Alert and oriented, Non-Toxic, Normal speech and affect Cardiovascular: Regular rhythm Pulmonary: Unlabored Bilateral groin hidradenitis unchanged but not in an active flare.  Assessment/Plan We went through the surgical approach for bilateral inguinal hidradenitis excision.  The patient is not interested in complex reconstruction so I will essentially remove as much of the diseased tissue as I can while still maintaining a primary closure.  There is some disease further distally in the upper thigh and I will  determine the amount that can be excised at the time of surgery by tailor tacking the skin.  We reviewed risks include bleeding, infection, damage to surrounding structures need for additional procedures.  All of her questions were answered and we will plan to move forward.  I will plan to send her in pain medicine and nausea medicine so she will have it for after surgery.  [name] 11/23/2020, 10:45 AM

## 2020-11-23 NOTE — H&P (View-Only) (Signed)
Referring Provider Caryl Bis, MD [place],  [place] [address]   CC:  Chief Complaint  Patient presents with   Pre-op Exam      Mary Pham is an 35 y.o. female.  HPI: Patient is here for her preop for upcoming surgical procedure.  This is for bilateral inguinal hidradenitis excision.  She has a number of questions which we will go through today.  Allergies  Allergen Reactions   Guaifenesin-Codeine     Stomach cramping    Outpatient Encounter Medications as of 11/23/2020  Medication Sig   Calcium Citrate-Vitamin D (CALCIUM + D PO) Take 1 tablet by mouth 3 (three) times daily.   Clascoterone (WINLEVI) 1 % CREA Apply to acne qhs   levonorgestrel (MIRENA) 20 MCG/24HR IUD 1 each by Intrauterine route once.   Multiple Vitamins-Minerals (BARIATRIC FUSION PO) Take 1 tablet by mouth daily. "bariatric advantage"   spironolactone (ALDACTONE) 25 MG tablet Take 2 tablets (50 mg total) by mouth 2 (two) times daily.   No facility-administered encounter medications on file as of 11/23/2020.     Past Medical History:  Diagnosis Date   Contraceptive education 10/09/2012   Hidradenitis suppurativa    IBS (irritable bowel syndrome)    Irregular bleeding 10/09/2012   Migraines    during pregnancy   Pregnancy induced hypertension    during pregnancy  7 yeasrs ago    Past Surgical History:  Procedure Laterality Date   CESAREAN SECTION  02/01/2012   Procedure: CESAREAN SECTION;  Surgeon: Guss Bunde, MD;  Location: [facility];  Service: Obstetrics;  Laterality: N/A;  Primary Cesarean Section Delivery Baby Boy @ 0221, Apgars 7/9   CHOLECYSTECTOMY     GASTRIC ROUX-EN-Y N/A 02/02/2019   Procedure: LAPAROSCOPIC ROUX-EN-Y GASTRIC BYPASS WITH UPPER ENDOSCOPY, Upper Endo-ERAS Pathway;  Surgeon: Johnathan Hausen, MD;  Location[facility];  Service: General;  Laterality: N/A;   nexplanon implant  12/11/2018   in the office   PANNICULECTOMY N/A 03/15/2020   Procedure: ABDOMINAL PANNICULECTOMY;   Surgeon: Johnathan Hausen, MD;  Location[facility];  Service: General;  Laterality: N/A;  ROOM 3 STARTING AT 01:15PM FOR 71 MIN    Family History  Problem Relation Age of Onset   Hypertension Father    Diabetes Father    Heart disease Maternal Grandmother    Heart disease Maternal Grandfather    Cancer Paternal Grandmother        lung   Stroke Paternal Grandfather     Social History   Social History Narrative   Not on file     Review of Systems General: Denies fevers, chills, weight loss CV: Denies chest pain, shortness of breath, palpitations  Physical Exam Vitals with BMI 11/23/2020 10/04/2020 06/29/2020  Height 5\' 5"  5\' 5"  5\' 5"   Weight 161 lbs 6 oz 165 lbs 169 lbs  BMI 26.86 00.17 49.44  Systolic 967 591 638  Diastolic 93 92 76  Pulse 62 - 72    General:  No acute distress,  Alert and oriented, Non-Toxic, Normal speech and affect Cardiovascular: Regular rhythm Pulmonary: Unlabored Bilateral groin hidradenitis unchanged but not in an active flare.  Assessment/Plan We went through the surgical approach for bilateral inguinal hidradenitis excision.  The patient is not interested in complex reconstruction so I will essentially remove as much of the diseased tissue as I can while still maintaining a primary closure.  There is some disease further distally in the upper thigh and I will  determine the amount that can be excised at the time of surgery by tailor tacking the skin.  We reviewed risks include bleeding, infection, damage to surrounding structures need for additional procedures.  All of her questions were answered and we will plan to move forward.  I will plan to send her in pain medicine and nausea medicine so she will have it for after surgery.  [name] 11/23/2020, 10:45 AM

## 2020-11-29 ENCOUNTER — Ambulatory Visit: Payer: No Typology Code available for payment source | Admitting: Physician Assistant

## 2020-12-02 ENCOUNTER — Encounter (HOSPITAL_BASED_OUTPATIENT_CLINIC_OR_DEPARTMENT_OTHER): Payer: Self-pay | Admitting: Plastic Surgery

## 2020-12-09 ENCOUNTER — Ambulatory Visit (HOSPITAL_BASED_OUTPATIENT_CLINIC_OR_DEPARTMENT_OTHER): Payer: No Typology Code available for payment source | Admitting: Anesthesiology

## 2020-12-09 ENCOUNTER — Ambulatory Visit (HOSPITAL_BASED_OUTPATIENT_CLINIC_OR_DEPARTMENT_OTHER)
Admission: RE | Admit: 2020-12-09 | Discharge: 2020-12-09 | Disposition: A | Discharge status: Home / Self Care | Payer: No Typology Code available for payment source | Attending: Plastic Surgery | Admitting: Plastic Surgery

## 2020-12-09 ENCOUNTER — Other Ambulatory Visit: Payer: Self-pay

## 2020-12-09 ENCOUNTER — Encounter (HOSPITAL_BASED_OUTPATIENT_CLINIC_OR_DEPARTMENT_OTHER): Payer: Self-pay | Admitting: Plastic Surgery

## 2020-12-09 ENCOUNTER — Encounter (HOSPITAL_BASED_OUTPATIENT_CLINIC_OR_DEPARTMENT_OTHER)
Admission: RE | Disposition: A | Discharge status: Home / Self Care | Payer: Self-pay | Source: Home / Self Care | Attending: Plastic Surgery

## 2020-12-09 DIAGNOSIS — Z9884 Bariatric surgery status: Secondary | ICD-10-CM | POA: Insufficient documentation

## 2020-12-09 DIAGNOSIS — L732 Hidradenitis suppurativa: Secondary | ICD-10-CM

## 2020-12-09 HISTORY — PX: HYDRADENITIS EXCISION: SHX5243

## 2020-12-09 LAB — POCT PREGNANCY, URINE: Preg Test, Ur: NEGATIVE

## 2020-12-09 SURGERY — EXCISION, HIDRADENITIS, INGUINAL REGION
Anesthesia: General | Site: Groin | Laterality: Bilateral

## 2020-12-09 MED ORDER — OXYCODONE HCL 5 MG PO TABS
ORAL_TABLET | ORAL | Status: AC
  Filled 2020-12-09: qty 1

## 2020-12-09 MED ORDER — FENTANYL CITRATE (PF) 100 MCG/2ML IJ SOLN
INTRAMUSCULAR | Status: AC
  Filled 2020-12-09: qty 2

## 2020-12-09 MED ORDER — LIDOCAINE 2% (20 MG/ML) 5 ML SYRINGE
INTRAMUSCULAR | Status: AC
  Filled 2020-12-09: qty 5

## 2020-12-09 MED ORDER — FENTANYL CITRATE (PF) 100 MCG/2ML IJ SOLN
25.0000 ug | INTRAMUSCULAR | Status: DC | PRN
  Administered 2020-12-09: 25 ug via INTRAVENOUS
  Administered 2020-12-09: 50 ug via INTRAVENOUS

## 2020-12-09 MED ORDER — ACETAMINOPHEN 500 MG PO TABS
1000.0000 mg | ORAL_TABLET | Freq: Once | ORAL | Status: AC
  Administered 2020-12-09: 1000 mg via ORAL

## 2020-12-09 MED ORDER — MIDAZOLAM HCL 5 MG/5ML IJ SOLN
INTRAMUSCULAR | Status: DC | PRN
  Administered 2020-12-09: 2 mg via INTRAVENOUS

## 2020-12-09 MED ORDER — CEFAZOLIN SODIUM-DEXTROSE 2-4 GM/100ML-% IV SOLN
INTRAVENOUS | Status: AC
  Filled 2020-12-09: qty 100

## 2020-12-09 MED ORDER — LIDOCAINE 2% (20 MG/ML) 5 ML SYRINGE
INTRAMUSCULAR | Status: DC | PRN
  Administered 2020-12-09: 80 mg via INTRAVENOUS

## 2020-12-09 MED ORDER — SCOPOLAMINE 1 MG/3DAYS TD PT72
1.0000 | MEDICATED_PATCH | TRANSDERMAL | Status: DC
  Administered 2020-12-09: 1.5 mg via TRANSDERMAL

## 2020-12-09 MED ORDER — PROPOFOL 500 MG/50ML IV EMUL
INTRAVENOUS | Status: AC
  Filled 2020-12-09: qty 50

## 2020-12-09 MED ORDER — OXYCODONE HCL 5 MG PO TABS
5.0000 mg | ORAL_TABLET | Freq: Once | ORAL | Status: AC
  Administered 2020-12-09: 5 mg via ORAL

## 2020-12-09 MED ORDER — DIPHENHYDRAMINE HCL 50 MG/ML IJ SOLN
INTRAMUSCULAR | Status: AC
  Filled 2020-12-09: qty 1

## 2020-12-09 MED ORDER — EPINEPHRINE PF 1 MG/ML IJ SOLN
INTRAMUSCULAR | Status: AC
  Filled 2020-12-09: qty 1

## 2020-12-09 MED ORDER — ONDANSETRON HCL 4 MG/2ML IJ SOLN
INTRAMUSCULAR | Status: AC
  Filled 2020-12-09: qty 2

## 2020-12-09 MED ORDER — DEXAMETHASONE SODIUM PHOSPHATE 10 MG/ML IJ SOLN
INTRAMUSCULAR | Status: DC | PRN
Start: 2020-12-09 — End: 2020-12-09
  Administered 2020-12-09: 10 mg via INTRAVENOUS

## 2020-12-09 MED ORDER — LACTATED RINGERS IV SOLN: INTRAVENOUS | Status: DC

## 2020-12-09 MED ORDER — PROMETHAZINE HCL 25 MG/ML IJ SOLN
6.2500 mg | INTRAMUSCULAR | Status: DC | PRN
Start: 2020-12-09 — End: 2020-12-09

## 2020-12-09 MED ORDER — MIDAZOLAM HCL 2 MG/2ML IJ SOLN
INTRAMUSCULAR | Status: AC
  Filled 2020-12-09: qty 2

## 2020-12-09 MED ORDER — DIPHENHYDRAMINE HCL 50 MG/ML IJ SOLN
INTRAMUSCULAR | Status: DC | PRN
  Administered 2020-12-09: 12.5 mg via INTRAVENOUS

## 2020-12-09 MED ORDER — BUPIVACAINE HCL (PF) 0.25 % IJ SOLN
INTRAMUSCULAR | Status: AC
  Filled 2020-12-09: qty 60

## 2020-12-09 MED ORDER — SCOPOLAMINE 1 MG/3DAYS TD PT72
MEDICATED_PATCH | TRANSDERMAL | Status: AC
  Filled 2020-12-09: qty 1

## 2020-12-09 MED ORDER — BUPIVACAINE-EPINEPHRINE (PF) 0.25% -1:200000 IJ SOLN
INTRAMUSCULAR | Status: AC
  Filled 2020-12-09: qty 30

## 2020-12-09 MED ORDER — LIDOCAINE-EPINEPHRINE 1 %-1:100000 IJ SOLN
INTRAMUSCULAR | Status: AC
  Filled 2020-12-09: qty 1

## 2020-12-09 MED ORDER — ACETAMINOPHEN 500 MG PO TABS
ORAL_TABLET | ORAL | Status: AC
  Filled 2020-12-09: qty 2

## 2020-12-09 MED ORDER — PROPOFOL 10 MG/ML IV BOLUS
INTRAVENOUS | Status: DC | PRN
  Administered 2020-12-09: 150 mg via INTRAVENOUS

## 2020-12-09 MED ORDER — FENTANYL CITRATE (PF) 100 MCG/2ML IJ SOLN
INTRAMUSCULAR | Status: DC | PRN
  Administered 2020-12-09: 50 ug via INTRAVENOUS
  Administered 2020-12-09 (×6): 25 ug via INTRAVENOUS

## 2020-12-09 MED ORDER — CEFAZOLIN SODIUM-DEXTROSE 2-4 GM/100ML-% IV SOLN
2.0000 g | INTRAVENOUS | Status: AC
  Administered 2020-12-09: 2 g via INTRAVENOUS

## 2020-12-09 MED ORDER — ONDANSETRON HCL 4 MG/2ML IJ SOLN
INTRAMUSCULAR | Status: DC | PRN
  Administered 2020-12-09: 4 mg via INTRAVENOUS

## 2020-12-09 MED ORDER — LACTATED RINGERS IV SOLN
INTRAVENOUS | Status: DC | PRN
  Administered 2020-12-09: 500 mL

## 2020-12-09 MED ORDER — DEXAMETHASONE SODIUM PHOSPHATE 10 MG/ML IJ SOLN
INTRAMUSCULAR | Status: AC
  Filled 2020-12-09: qty 1

## 2020-12-09 SURGICAL SUPPLY — 79 items
ADH SKN CLS APL DERMABOND .7 (GAUZE/BANDAGES/DRESSINGS)
APL PRP STRL LF DISP 70% ISPRP (MISCELLANEOUS) ×1
APL SKNCLS STERI-STRIP NONHPOA (GAUZE/BANDAGES/DRESSINGS) ×1
BAND INSRT 18 STRL LF DISP RB (MISCELLANEOUS)
BAND RUBBER #18 3X1/16 STRL (MISCELLANEOUS) IMPLANT
BENZOIN TINCTURE PRP APPL 2/3 (GAUZE/BANDAGES/DRESSINGS) ×3 IMPLANT
BLADE CLIPPER SURG (BLADE) IMPLANT
BLADE SURG 10 STRL SS (BLADE) ×6 IMPLANT
BLADE SURG 15 STRL LF DISP TIS (BLADE) ×2 IMPLANT
BLADE SURG 15 STRL SS (BLADE) ×6
CANISTER SUCT 1200ML W/VALVE (MISCELLANEOUS) IMPLANT
CHLORAPREP W/TINT 26 (MISCELLANEOUS) ×3 IMPLANT
CLOSURE WOUND 1/2 X4 (GAUZE/BANDAGES/DRESSINGS) ×2
COVER BACK TABLE 60X90IN (DRAPES) IMPLANT
COVER MAYO STAND STRL (DRAPES) ×3 IMPLANT
DERMABOND ADVANCED (GAUZE/BANDAGES/DRESSINGS)
DERMABOND ADVANCED .7 DNX12 (GAUZE/BANDAGES/DRESSINGS) IMPLANT
DRAIN JP 10F RND SILICONE (MISCELLANEOUS) IMPLANT
DRAPE LAPAROTOMY 100X72 PEDS (DRAPES) IMPLANT
DRAPE U-SHAPE 76X120 STRL (DRAPES) IMPLANT
DRAPE UTILITY XL STRL (DRAPES) ×6 IMPLANT
DRSG PAD ABDOMINAL 8X10 ST (GAUZE/BANDAGES/DRESSINGS) ×6 IMPLANT
DRSG TELFA 3X8 NADH (GAUZE/BANDAGES/DRESSINGS) IMPLANT
ELECT COATED BLADE 2.86 ST (ELECTRODE) IMPLANT
ELECT NEEDLE BLADE 2-5/6 (NEEDLE) IMPLANT
ELECT REM PT RETURN 9FT ADLT (ELECTROSURGICAL) ×3
ELECT REM PT RETURN 9FT PED (ELECTROSURGICAL)
ELECTRODE REM PT RETRN 9FT PED (ELECTROSURGICAL) IMPLANT
ELECTRODE REM PT RTRN 9FT ADLT (ELECTROSURGICAL) ×1 IMPLANT
EVACUATOR SILICONE 100CC (DRAIN) IMPLANT
GAUZE SPONGE 4X4 12PLY STRL (GAUZE/BANDAGES/DRESSINGS) ×6 IMPLANT
GAUZE SPONGE 4X4 12PLY STRL LF (GAUZE/BANDAGES/DRESSINGS) IMPLANT
GAUZE XEROFORM 1X8 LF (GAUZE/BANDAGES/DRESSINGS) IMPLANT
GLOVE SRG 8 PF TXTR STRL LF DI (GLOVE) ×1 IMPLANT
GLOVE SURG ENC TEXT LTX SZ7.5 (GLOVE) ×3 IMPLANT
GLOVE SURG UNDER POLY LF SZ8 (GLOVE) ×3
GOWN STRL REUS W/ TWL LRG LVL3 (GOWN DISPOSABLE) ×2 IMPLANT
GOWN STRL REUS W/TWL 2XL LVL3 (GOWN DISPOSABLE) ×6 IMPLANT
GOWN STRL REUS W/TWL LRG LVL3 (GOWN DISPOSABLE) ×6
NDL SAFETY ECLIPSE 18X1.5 (NEEDLE) ×1 IMPLANT
NEEDLE FILTER BLUNT 18X 1/2SAF (NEEDLE) ×2
NEEDLE FILTER BLUNT 18X1 1/2 (NEEDLE) ×1 IMPLANT
NEEDLE HYPO 18GX1.5 SHARP (NEEDLE) ×3
NEEDLE HYPO 27GX1-1/4 (NEEDLE) IMPLANT
NEEDLE HYPO 30GX1 BEV (NEEDLE) IMPLANT
NEEDLE SPNL 18GX3.5 QUINCKE PK (NEEDLE) ×3 IMPLANT
NS IRRIG 1000ML POUR BTL (IV SOLUTION) ×3 IMPLANT
PACK BASIN DAY SURGERY FS (CUSTOM PROCEDURE TRAY) ×3 IMPLANT
PACK LITHOTOMY IV (CUSTOM PROCEDURE TRAY) ×3 IMPLANT
PENCIL SMOKE EVACUATOR (MISCELLANEOUS) ×3 IMPLANT
SHEET MEDIUM DRAPE 40X70 STRL (DRAPES) IMPLANT
SLEEVE SCD COMPRESS KNEE MED (STOCKING) ×3 IMPLANT
SPONGE GAUZE 2X2 8PLY STER LF (GAUZE/BANDAGES/DRESSINGS)
SPONGE GAUZE 2X2 8PLY STRL LF (GAUZE/BANDAGES/DRESSINGS) IMPLANT
SPONGE T-LAP 18X18 ~~LOC~~+RFID (SPONGE) ×3 IMPLANT
STAPLER INSORB 30 2030 C-SECTI (MISCELLANEOUS) ×6 IMPLANT
STAPLER VISISTAT 35W (STAPLE) ×6 IMPLANT
STRIP CLOSURE SKIN 1/2X4 (GAUZE/BANDAGES/DRESSINGS) ×4 IMPLANT
SUCTION FRAZIER HANDLE 10FR (MISCELLANEOUS)
SUCTION TUBE FRAZIER 10FR DISP (MISCELLANEOUS) IMPLANT
SUT ETHILON 4 0 PS 2 18 (SUTURE) IMPLANT
SUT MNCRL AB 4-0 PS2 18 (SUTURE) IMPLANT
SUT MON AB 5-0 P3 18 (SUTURE) IMPLANT
SUT PDS 3-0 CT2 (SUTURE)
SUT PDS II 3-0 CT2 27 ABS (SUTURE) IMPLANT
SUT PLAIN 5 0 P 3 18 (SUTURE) IMPLANT
SUT PROLENE 5 0 P 3 (SUTURE) IMPLANT
SUT VICRYL 4-0 PS2 18IN ABS (SUTURE) IMPLANT
SUT VLOC 90 P-14 23 (SUTURE) ×6 IMPLANT
SWAB COLLECTION DEVICE MRSA (MISCELLANEOUS) IMPLANT
SWAB CULTURE ESWAB REG 1ML (MISCELLANEOUS) IMPLANT
SYR 50ML LL SCALE MARK (SYRINGE) IMPLANT
SYR BULB EAR ULCER 3OZ GRN STR (SYRINGE) IMPLANT
SYR CONTROL 10ML LL (SYRINGE) IMPLANT
TOWEL GREEN STERILE FF (TOWEL DISPOSABLE) ×3 IMPLANT
TRAY DSU PREP LF (CUSTOM PROCEDURE TRAY) ×3 IMPLANT
TUBE CONNECTING 20'X1/4 (TUBING)
TUBE CONNECTING 20X1/4 (TUBING) IMPLANT
TUBING INFILTRATION IT-10001 (TUBING) ×3 IMPLANT

## 2020-12-09 NOTE — Discharge Instructions (Addendum)
Activity as tolerated, avoid any strenuous activities or heavy lifting.  You can continue to ambulate, but be sure to avoid any deep bending or stretching of the upper thigh to prevent increased risk of incision separation.  Recommend applying biker shorts or compression shorts over top of dressings applied to your thighs today and surgery.  The compression will help decrease risk of fluid collection, bleeding and will also help with pain.   No heavy activities Take Pain medication (Percocet) as needed for severe pain. Otherwise, you can use ibuprofen or tylenol as needed. Avoid more than 3,000 mg of tylenol in 24 hours. Norco has 325mg  of tylenol per dose.  Diet: Regular. Drink plenty of fluids and eat healthy, high protein, low carbs.  Wound Care: Keep dressing clean & dry. You may change bandages after showering if you continue to notice some drainage. You can reuse bandages if they are not dirty/soiled.  Special Instructions: Call Doctor if any unusual problems occur such as pain, excessive Bleeding, unrelieved Nausea/vomiting, Fever &/or chills  Follow-up appointment: Scheduled for next week.  Next dose of Tylenol at home after 12:50pm as needed for pain.   Post Anesthesia Home Care Instructions  Activity: Get plenty of rest for the remainder of the day. A responsible individual must stay with you for 24 hours following the procedure.  For the next 24 hours, DO NOT: -Drive a car -Paediatric nurse -Drink alcoholic beverages -Take any medication unless instructed by your physician -Make any legal decisions or sign important papers.  Meals: Start with liquid foods such as gelatin or soup. Progress to regular foods as tolerated. Avoid greasy, spicy, heavy foods. If nausea and/or vomiting occur, drink only clear liquids until the nausea and/or vomiting subsides. Call your physician if vomiting continues.  Special Instructions/Symptoms: Your throat may feel dry or sore from the  anesthesia or the breathing tube placed in your throat during surgery. If this causes discomfort, gargle with warm salt water. The discomfort should disappear within 24 hours.  If you had a scopolamine patch placed behind your ear for the management of post- operative nausea and/or vomiting:  1. The medication in the patch is effective for 72 hours, after which it should be removed.  Wrap patch in a tissue and discard in the trash. Wash hands thoroughly with soap and water. 2. You may remove the patch earlier than 72 hours if you experience unpleasant side effects which may include dry mouth, dizziness or visual disturbances. 3. Avoid touching the patch. Wash your hands with soap and water after contact with the patch.

## 2020-12-09 NOTE — Op Note (Signed)
Operative Note   DATE OF OPERATION: 12/09/2020  SURGICAL DEPARTMENT: Plastic Surgery  PREOPERATIVE DIAGNOSES: Bilateral groin hidradenitis  POSTOPERATIVE DIAGNOSES:  same  PROCEDURE: 1.  Excision of right groin hidradenitis totaling 26 x 9 cm in length 2.  Excision left groin hidradenitis totaling 25 x 9 cm in length 3.  Complex closure bilateral groin totaling 51 cm in length  SURGEON: Talmadge Coventry, MD  ASSISTANT: Verdie Shire, PA The advanced practice practitioner (APP) assisted throughout the case.  The APP was essential in retraction and counter traction when needed to make the case progress smoothly.  This retraction and assistance made it possible to see the tissue planes for the procedure.  The assistance was needed for hemostasis, tissue re-approximation and closure of the incision site.   ANESTHESIA:  General.   COMPLICATIONS: None.   INDICATIONS FOR PROCEDURE:  The patient, Mary Pham is a 35 y.o. female born on [birth year], is here for treatment of bilateral groin hidradenitis MRN: [id number]  CONSENT:  Informed consent was obtained directly from the patient. Risks, benefits and alternatives were fully discussed. Specific risks including but not limited to bleeding, infection, hematoma, seroma, scarring, pain, contracture, asymmetry, wound healing problems, and need for further surgery were all discussed. The patient did have an ample opportunity to have questions answered to satisfaction.   DESCRIPTION OF PROCEDURE:  The patient was taken to the operating room. SCDs were placed and antibiotics were given.  General anesthesia was administered.  The patient's operative site was prepped and draped in a sterile fashion. A time out was performed and all information was confirmed to be correct.  I started by identifying the areas of disease skin.  These were present mostly along the groin crease and seem to be focused in a higher density posteriorly on the thigh.  I marked  out these areas with a marking pen.  I then extended the ellipse anteriorly and posteriorly to accommodate all the diseased tissue and still provide an appropriate contour.  I then stapled the estimated excision to ensure that there was not too much tension and also not too much traction on the vulva.  Once this seems to be appropriate the estimated excision was marked.  The staples were removed.  Tumescence was infused on both sides with about 200 cc on each side.  This was given time to work.  Both sides were then excised with a combination of 10 blade and cautery.  Meticulous hemostasis was obtained.  Surrounding skin was then undermined and advanced and closed in layers was interrupted buried Enzor staples and a running 3 oh V-Loc.  Steri-Strips and a soft dressing were applied.  The patient tolerated the procedure well.  There were no complications. The patient was allowed to wake from anesthesia, extubated and taken to the recovery room in satisfactory condition.

## 2020-12-09 NOTE — Transfer of Care (Signed)
Immediate Anesthesia Transfer of Care Note  Patient: Mary Pham  Procedure(s) Performed: EXCISION HIDRADENITIS GROIN (Bilateral: Groin)  Patient Location: PACU  Anesthesia Type:General  Level of Consciousness: awake, alert  and oriented  Airway & Oxygen Therapy: Patient Spontanous Breathing and Patient connected to face mask oxygen  Post-op Assessment: Report given to RN and Post -op Vital signs reviewed and stable  Post vital signs: Reviewed and stable  Last Vitals:  Vitals Value Taken Time  BP 123/93 12/09/20 0928  Temp    Pulse 93 12/09/20 0929  Resp 12 12/09/20 0929  SpO2 100 % 12/09/20 0929  Vitals shown include unvalidated device data.  Last Pain:  Vitals:   12/09/20 0629  TempSrc: Oral  PainSc: 0-No pain      Patients Stated Pain Goal: 5 (33/17/40 9927)  Complications: No notable events documented.

## 2020-12-09 NOTE — Interval H&P Note (Signed)
Patient seen and examined. Risk and benefits discussed. Proceed with surgery.

## 2020-12-09 NOTE — Anesthesia Preprocedure Evaluation (Addendum)
Anesthesia Evaluation  Patient identified by MRN, date of birth, ID band Patient awake    Reviewed: Allergy & Precautions, NPO status , Patient's Chart, lab work & pertinent test results  Airway Mallampati: II  TM Distance: >3 FB Neck ROM: Full    Dental  (+) Teeth Intact, Dental Advisory Given   Pulmonary neg pulmonary ROS,    Pulmonary exam normal breath sounds clear to auscultation       Cardiovascular Exercise Tolerance: Good Normal cardiovascular exam Rhythm:Regular Rate:Normal     Neuro/Psych  Headaches,    GI/Hepatic Neg liver ROS, IBS H/o Roux-en-Y bypass   Endo/Other  negative endocrine ROS  Renal/GU negative Renal ROS     Musculoskeletal negative musculoskeletal ROS (+) Hidradenitis   Abdominal   Peds  Hematology negative hematology ROS (+)   Anesthesia Other Findings Day of surgery medications reviewed with the patient.  Reproductive/Obstetrics negative OB ROS                            Anesthesia Physical Anesthesia Plan  ASA: 2  Anesthesia Plan: General   Post-op Pain Management:    Induction: Intravenous  PONV Risk Score and Plan: 3 and Midazolam, Dexamethasone and Ondansetron  Airway Management Planned: Oral ETT  Additional Equipment:   Intra-op Plan:   Post-operative Plan: Extubation in OR  Informed Consent: I have reviewed the patients History and Physical, chart, labs and discussed the procedure including the risks, benefits and alternatives for the proposed anesthesia with the patient or authorized representative who has indicated his/her understanding and acceptance.     Dental advisory given  Plan Discussed with: CRNA  Anesthesia Plan Comments:         Anesthesia Quick Evaluation

## 2020-12-09 NOTE — Anesthesia Postprocedure Evaluation (Signed)
Anesthesia Post Note  Patient: Mary Pham  Procedure(s) Performed: EXCISION HIDRADENITIS GROIN (Bilateral: Groin)     Patient location during evaluation: PACU Anesthesia Type: General Level of consciousness: awake and alert Pain management: pain level controlled Vital Signs Assessment: post-procedure vital signs reviewed and stable Respiratory status: spontaneous breathing, nonlabored ventilation and respiratory function stable Cardiovascular status: blood pressure returned to baseline and stable Postop Assessment: no apparent nausea or vomiting Anesthetic complications: no   No notable events documented.  Last Vitals:  Vitals:   12/09/20 1035 12/09/20 1050  BP:  130/75  Pulse:  80  Resp:  16  Temp:  36.6 C  SpO2: 100% 100%    Last Pain:  Vitals:   12/09/20 1050  TempSrc: Axillary  PainSc: [name]

## 2020-12-09 NOTE — Anesthesia Procedure Notes (Signed)
Procedure Name: LMA Insertion Date/Time: 12/09/2020 7:54 AM Performed by: Genelle Bal, CRNA Pre-anesthesia Checklist: Patient identified, Emergency Drugs available, Suction available and Patient being monitored Patient Re-evaluated:Patient Re-evaluated prior to induction Oxygen Delivery Method: Circle system utilized Preoxygenation: Pre-oxygenation with 100% oxygen Induction Type: IV induction Ventilation: Mask ventilation without difficulty LMA: LMA inserted LMA Size: 4.0 Number of attempts: 1 Airway Equipment and Method: Bite block Placement Confirmation: positive ETCO2 Tube secured with: Tape Dental Injury: Teeth and Oropharynx as per pre-operative assessment

## 2020-12-12 ENCOUNTER — Encounter (HOSPITAL_BASED_OUTPATIENT_CLINIC_OR_DEPARTMENT_OTHER): Payer: Self-pay | Admitting: Plastic Surgery

## 2020-12-14 ENCOUNTER — Other Ambulatory Visit: Payer: Self-pay

## 2020-12-14 ENCOUNTER — Ambulatory Visit (INDEPENDENT_AMBULATORY_CARE_PROVIDER_SITE_OTHER): Payer: No Typology Code available for payment source | Admitting: Plastic Surgery

## 2020-12-14 DIAGNOSIS — L732 Hidradenitis suppurativa: Secondary | ICD-10-CM

## 2020-12-14 LAB — SURGICAL PATHOLOGY

## 2020-12-14 NOTE — Progress Notes (Signed)
Patient presents postop from excision of bilateral groin hidradenitis.  She is overall happy with how things are going.  On exam the incisions look to be healing nicely.  She has a great improvement in the contour of her thigh as well.  No signs of any wound breakdown or subcutaneous fluid.  We will plan to avoid strenuous activity and she is going to continue to wear her compressive shorts.  We will see her again in a few weeks.  All her questions were answered.

## 2020-12-20 ENCOUNTER — Telehealth: Payer: Self-pay | Admitting: *Deleted

## 2020-12-20 NOTE — Telephone Encounter (Signed)
Received Medical Management Request on (12/15/20) via of fax from [employer].  Requesting Pathology results from Procedure done on (12/09/20).  Giving to provider to complete and return.    Request completed and faxed back to [facility].  Confirmation received and copy scanned into the chart.//AB/CMA

## 2020-12-29 ENCOUNTER — Other Ambulatory Visit: Payer: Self-pay | Admitting: Adult Health

## 2020-12-29 ENCOUNTER — Other Ambulatory Visit (HOSPITAL_BASED_OUTPATIENT_CLINIC_OR_DEPARTMENT_OTHER): Payer: Self-pay

## 2020-12-29 MED ORDER — METRONIDAZOLE 0.75 % VA GEL
VAGINAL | 1 refills | Status: DC
  Filled 2020-12-29: qty 70, 5d supply, fill #0
  Filled 2021-05-10: qty 70, 5d supply, fill #1

## 2021-01-05 ENCOUNTER — Other Ambulatory Visit: Payer: Self-pay

## 2021-01-05 ENCOUNTER — Ambulatory Visit: Payer: No Typology Code available for payment source | Admitting: Plastic Surgery

## 2021-01-05 DIAGNOSIS — L732 Hidradenitis suppurativa: Secondary | ICD-10-CM

## 2021-01-05 NOTE — Progress Notes (Signed)
Patient presents about a month out from excision of bilateral groin hidradenitis.  She feels very good.  She is happy with the outcome and the contour.  On exam everything looks like it is healed nicely.  No signs of any complications.  We discussed activity moving forward.  Overall she is very happy and I think is at a point where she can follow-up as needed.  She will be in touch with [place] if anything else comes up.  All of her questions were answered.

## 2021-01-19 ENCOUNTER — Ambulatory Visit: Payer: No Typology Code available for payment source | Admitting: Dermatology

## 2021-01-26 ENCOUNTER — Telehealth: Payer: Self-pay | Admitting: Plastic Surgery

## 2021-01-26 ENCOUNTER — Telehealth: Payer: Self-pay

## 2021-01-26 NOTE — Telephone Encounter (Signed)
Yes dissolvable staples.  Not an emergency.  She can remove or we can remove when it's convenient.

## 2021-01-26 NOTE — Telephone Encounter (Signed)
Patient called and said that she has white plastic hook objects, that does not feel like stitches, coming out of right leg. She said it is very painful to sit down.  Please follow up with patient.

## 2021-01-26 NOTE — Telephone Encounter (Signed)
Pt is aware of provider's suggestions. 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 She will give it time to work itself out or remove when she's able.

## 2021-01-26 NOTE — Telephone Encounter (Signed)
Patient called to say she looked online and it seems like it may be an internal staple that broke in half.  She said that half of the staple came out on it's own and she got the other half out.  She said it looks like there are more in there.  Patient would like to know if we used dissolvable staples.  She would like to know if she needs to come in and have [place] look at it.  She said she didn't think it was an emergency.  Please call.

## 2021-02-01 ENCOUNTER — Other Ambulatory Visit: Payer: Self-pay

## 2021-02-01 ENCOUNTER — Ambulatory Visit (INDEPENDENT_AMBULATORY_CARE_PROVIDER_SITE_OTHER): Payer: No Typology Code available for payment source | Admitting: Plastic Surgery

## 2021-02-01 DIAGNOSIS — L732 Hidradenitis suppurativa: Secondary | ICD-10-CM

## 2021-02-01 NOTE — Progress Notes (Signed)
Patient presents about 2 months postop from excision of bilateral groin hidradenitis.  She feels like there was some spitting absorbable staples and wanted to come in to be evaluated.  She had some pain and some drainage from the posterior aspect on the right side.  On exam today everything looks to be sealed up.  I see 1 in sorb staple that is beneath the skin anteriorly but has not yet eroded through.  Otherwise the wound looks to be healed and do not see any signs of infection.  I explained that I did not like to open up the skin to retrieve absorbable sutures or staples as it should take care of itself.  If she does notice any further spitting staples or sutures not happy to see her back.  All of her questions were answered.

## 2021-02-21 ENCOUNTER — Encounter: Payer: Self-pay | Admitting: Adult Health

## 2021-02-21 ENCOUNTER — Other Ambulatory Visit: Payer: Self-pay

## 2021-02-21 ENCOUNTER — Other Ambulatory Visit (HOSPITAL_BASED_OUTPATIENT_CLINIC_OR_DEPARTMENT_OTHER): Payer: Self-pay

## 2021-02-21 ENCOUNTER — Ambulatory Visit: Payer: No Typology Code available for payment source | Admitting: Adult Health

## 2021-02-21 VITALS — BP 123/75 | HR 62 | Ht 65.0 in | Wt 166.5 lb

## 2021-02-21 DIAGNOSIS — Z975 Presence of (intrauterine) contraceptive device: Secondary | ICD-10-CM

## 2021-02-21 DIAGNOSIS — Z3202 Encounter for pregnancy test, result negative: Secondary | ICD-10-CM | POA: Diagnosis not present

## 2021-02-21 DIAGNOSIS — N939 Abnormal uterine and vaginal bleeding, unspecified: Secondary | ICD-10-CM

## 2021-02-21 DIAGNOSIS — N9489 Other specified conditions associated with female genital organs and menstrual cycle: Secondary | ICD-10-CM | POA: Diagnosis not present

## 2021-02-21 LAB — POCT URINE PREGNANCY: Preg Test, Ur: NEGATIVE

## 2021-02-21 MED ORDER — MEGESTROL ACETATE 40 MG PO TABS
40.0000 mg | ORAL_TABLET | Freq: Every day | ORAL | 1 refills | Status: DC
  Filled 2021-02-21: qty 60, 60d supply, fill #0

## 2021-02-21 NOTE — Progress Notes (Signed)
°  Subjective:     Patient ID: BRAD MCGAUGHY, female   DOB: 11/18/1985, 36 y.o.   MRN: [id number]  HPI Renaye is a 36 year old white female,married, G1P1 in complaining of bleeding with IUD and  cramping and bleeding stopped today. She had gastric by pass in January 2021 and has had panniculectomy since and thighs done.  PCP is Dr Quillian Quince. Lab Results  Component Value Date   DIAGPAP  03/27/2019    - Negative for intraepithelial lesion or malignancy (NILM)   [place] Negative 03/27/2019    Review of Systems +vaginal bleeding with IUD and cramping Reviewed past medical,surgical, social and family history. Reviewed medications and allergies.     Objective:   Physical Exam BP 123/75 (BP Location: Left Arm, Patient Position: Sitting, Cuff Size: Normal)    Pulse 62    Ht 5\' 5"  (1.651 m)    Wt 166 lb 8 oz (75.5 kg)    BMI 27.71 kg/m  UPT is negative    Skin warm and dry.Pelvic: external genitalia is normal in appearance no lesions, vagina: tan discharge, no odor,urethra has no lesions or masses noted, cervix: bulbous,+IUD strings at os, uterus: normal size, shape and contour, non tender, no masses felt, adnexa: no masses or tenderness noted. Bladder is non tender and no masses felt. Fall risk is low  [facility] - 02/21/21 1555       Pregnancy Intention Screening   Does the patient want to become pregnant in the next year? No    Does the patient's partner want to become pregnant in the next year? No    Would the patient like to discuss contraceptive options today? No      Contraception Wrap Up   Current Method IUD or IUS    End Method IUD or IUS    Contraception Counseling Provided No            Examination chaperoned by Levy Pupa LPN  Assessment:     1. Pregnancy examination or test, negative result  2. Abnormal uterine bleeding (AUB) Will try megace to stop bleeding Meds ordered this encounter  Medications   megestrol (MEGACE) 40 MG tablet    Sig: Take 1 tablet (40 mg total) by  mouth daily.    Dispense:  60 tablet    Refill:  1    Order Specific Question:   Supervising Provider    Answer:   Elonda Husky, LUTHER H [[id number]]    3. Uterine cramping   4. IUD (intrauterine device) in place Mirena placed 03/27/19    Plan:     Follow up in 3 months or sooner of needed

## 2021-02-23 ENCOUNTER — Ambulatory Visit (INDEPENDENT_AMBULATORY_CARE_PROVIDER_SITE_OTHER): Payer: Self-pay | Admitting: Plastic Surgery

## 2021-02-23 ENCOUNTER — Other Ambulatory Visit: Payer: Self-pay

## 2021-02-23 VITALS — BP 134/90 | HR 76 | Ht 65.0 in | Wt 167.0 lb

## 2021-02-23 DIAGNOSIS — Z411 Encounter for cosmetic surgery: Secondary | ICD-10-CM

## 2021-02-23 NOTE — Progress Notes (Signed)
Referring Provider Caryl Bis, MD [place],  [place] [address]   CC:  Chief Complaint  Patient presents with   Advice Only      Mary Pham is an 36 y.o. female.  HPI: Patient presents to discuss a number of cosmetic concerns.  I have previously done a revision panniculectomy on her and also a excision of bilateral groin hidradenitis.  She has been very happy with these procedures.  She is currently just interested in breast augmentation with potentially a lift on the left side in addition to liposuction and removal of excess skin on her back.  Allergies  Allergen Reactions   Guaifenesin-Codeine     Stomach cramping    Outpatient Encounter Medications as of 02/23/2021  Medication Sig   Clascoterone (WINLEVI) 1 % CREA Apply to acne qhs   levonorgestrel (MIRENA) 20 MCG/24HR IUD 1 each by Intrauterine route once.   megestrol (MEGACE) 40 MG tablet Take 1 tablet (40 mg total) by mouth daily.   metroNIDAZOLE (METROGEL) 0.75 % vaginal gel PLACE 1 APPLICATORFUL VAGINALLY AT BEDTIME.   Sarecycline HCl (SEYSARA) 100 MG TABS Take by mouth in the morning and at bedtime.   No facility-administered encounter medications on file as of 02/23/2021.     Past Medical History:  Diagnosis Date   Contraceptive education 10/09/2012   Hidradenitis suppurativa    IBS (irritable bowel syndrome)    Irregular bleeding 10/09/2012   Migraines    during pregnancy   Pregnancy induced hypertension    during pregnancy  7 yeasrs ago    Past Surgical History:  Procedure Laterality Date   CESAREAN SECTION  02/01/2012   Procedure: CESAREAN SECTION;  Surgeon: Guss Bunde, MD;  Location: [facility];  Service: Obstetrics;  Laterality: N/A;  Primary Cesarean Section Delivery Baby Boy @ 0221, Apgars 7/9   CHOLECYSTECTOMY     GASTRIC ROUX-EN-Y N/A 02/02/2019   Procedure: LAPAROSCOPIC ROUX-EN-Y GASTRIC BYPASS WITH UPPER ENDOSCOPY, Upper Endo-ERAS Pathway;  Surgeon: Mary Hausen, MD;  Location: [facility];   Service: General;  Laterality: N/A;   HYDRADENITIS EXCISION Bilateral 12/09/2020   Procedure: EXCISION HIDRADENITIS GROIN;  Surgeon: [name], MD;  Location: [facility];  Service: Plastics;  Laterality: Bilateral;   nexplanon implant  12/11/2018   in the office   PANNICULECTOMY N/A 03/15/2020   Procedure: ABDOMINAL PANNICULECTOMY;  Surgeon: Mary Hausen, MD;  Location: [facility];  Service: General;  Laterality: N/A;  ROOM 3 STARTING AT 01:15PM FOR 90 MIN   tummy tuck  05/2020    Family History  Problem Relation Age of Onset   Hypertension Father    Diabetes Father    Heart disease Maternal Grandmother    Heart disease Maternal Grandfather    Cancer Paternal Grandmother        lung   Stroke Paternal Grandfather     Social History   Social History Narrative   Not on file     Review of Systems General: Denies fevers, chills, weight loss CV: Denies chest pain, shortness of breath, palpitations  Physical Exam Vitals with BMI 02/23/2021 02/21/2021 12/09/2020  Height 5\' 5"  5\' 5"  -  Weight 167 lbs 166 lbs 8 oz -  BMI 28.41 32.44 -  Systolic 010 272 536  Diastolic 90 75 75  Pulse 76 62 80    General:  No acute distress,  Alert and oriented, Non-Toxic, Normal speech and affect Breast: She has mild ptosis slightly greater on  the left side compared to the right side.  She is probably B cup in volume.  No obvious scars or masses.  Base with about 12 cm.  Upper pole pinch test greater than 1 cm Back: She does have excess skin generating folds in the upper back in addition to the lower back area.  No large scars on the back.  Assessment/Plan Patient is a good candidate for breast augmentation.  Sizers used today and she prefers 350 cc of volume.  We discussed a subglandular augmentation through an inframammary incision using gel implants.  We did discuss the pros and cons of the subglandular versus subpectoral plane.  She is in agreement with the subglandular plane.   We discussed the options in terms of incision and ultimately I recommended inframammary incision and she agrees.  We went over the pros and cons of saline versus gel implants.  I explained that there is the potential for silent rupture of the gel implants but that both devices were considered safe and durable.  I do feel that the gel implants provide a more natural consistency and shape and she is in agreement with moving forward with that.  My plan would be to place the implants in the pocket and then determine what would be required for the areola symmetry afterwards but likely she would need some degree of a skin excision on the left side.  Regarding the back she is particularly bothered by the excess skin folds in the upper back.  We discussed upper back liposuction combined with an upper back left to manage the skin redundancy.  I explained I would try to keep the scar in line with her bra line and that it may likely end up connecting with the inframammary approach to her breast augmentation.  She is not bothered by the scar and prefers to focus on contour.  Regarding the lower back I do think she would be a reasonable candidate for circumferential extension of her panniculectomy however I would not want to do this at the same time as an upper body lift.  She did ask for some conservative liposuction of the lower back area and I think that that would offer some benefit but I did explain to her that there would likely still be some skin excess there that may need to be addressed at another setting.  We reviewed risks of these procedures to include bleeding, infection, damage to surrounding structures need for additional procedures.  All of her questions were answered and she is interested in moving forward.  [name] 02/23/2021, 3:50 PM

## 2021-03-03 ENCOUNTER — Ambulatory Visit: Payer: No Typology Code available for payment source | Admitting: Adult Health

## 2021-03-04 ENCOUNTER — Other Ambulatory Visit (HOSPITAL_COMMUNITY): Payer: Self-pay

## 2021-03-04 MED ORDER — AMOXICILLIN-POT CLAVULANATE 875-125 MG PO TABS
ORAL_TABLET | ORAL | 0 refills | Status: DC
  Filled 2021-03-04: qty 14, 7d supply, fill #0

## 2021-03-04 MED ORDER — TAMSULOSIN HCL 0.4 MG PO CAPS
0.4000 mg | ORAL_CAPSULE | Freq: Every evening | ORAL | 0 refills | Status: DC
  Filled 2021-03-04: qty 30, 30d supply, fill #0

## 2021-03-04 MED ORDER — TRIAMCINOLONE ACETONIDE 0.1 % EX CREA
TOPICAL_CREAM | CUTANEOUS | 2 refills | Status: DC
  Filled 2021-03-04 – 2021-05-10 (×2): qty 80, 20d supply, fill #0
  Filled 2021-06-26: qty 80, 20d supply, fill #1

## 2021-03-15 ENCOUNTER — Institutional Professional Consult (permissible substitution): Payer: No Typology Code available for payment source | Admitting: Plastic Surgery

## 2021-05-08 ENCOUNTER — Other Ambulatory Visit (INDEPENDENT_AMBULATORY_CARE_PROVIDER_SITE_OTHER): Payer: No Typology Code available for payment source

## 2021-05-08 ENCOUNTER — Encounter: Payer: Self-pay | Admitting: Obstetrics & Gynecology

## 2021-05-08 ENCOUNTER — Other Ambulatory Visit (HOSPITAL_COMMUNITY)
Admission: RE | Admit: 2021-05-08 | Discharge: 2021-05-08 | Disposition: A | Discharge status: Home / Self Care | Payer: No Typology Code available for payment source | Source: Ambulatory Visit | Attending: Obstetrics & Gynecology | Admitting: Obstetrics & Gynecology

## 2021-05-08 DIAGNOSIS — N898 Other specified noninflammatory disorders of vagina: Secondary | ICD-10-CM | POA: Diagnosis present

## 2021-05-08 DIAGNOSIS — R399 Unspecified symptoms and signs involving the genitourinary system: Secondary | ICD-10-CM

## 2021-05-08 LAB — POCT URINALYSIS DIPSTICK OB
Glucose, UA: NEGATIVE
Ketones, UA: NEGATIVE
Nitrite, UA: NEGATIVE
POC,PROTEIN,UA: NEGATIVE

## 2021-05-08 NOTE — Progress Notes (Signed)
? ?  NURSE VISIT- VAGINITIS ? ?SUBJECTIVE:  ?Mary Pham is a 36 y.o. G1P1001 GYN patientfemale here for a vaginal swab for vaginitis screening.  She reports the following symptoms: discharge described as malodorous, dark, and mucous-like, local irritation, odor, urinary symptoms of cloudy urine, flank pain bilaterally, urinary retention, urinary urgency, and burning at end of stream, and pain  for 5 days. ?Denies abnormal vaginal bleeding, significant pelvic pain, fever, or UTI symptoms. ? ?OBJECTIVE:  ?There were no vitals taken for this visit.  ?Appears well, in no apparent distress ? ?ASSESSMENT: ?Vaginal swab for vaginitis screening ? ?PLAN: ?Self-collected vaginal probe for Bacterial Vaginosis, Yeast sent to lab ?Treatment: to be determined once results are received ?Follow-up as needed if symptoms persist/worsen, or new symptoms develop ? ? ? ?NURSE VISIT- UTI SYMPTOMS  ? ?SUBJECTIVE:  ?Mary Pham is a 36 y.o. G75P1001 female here for UTI symptoms. She is a GYN patient. She reports cloudy urine, flank pain bilaterally, urinary retention, urinary urgency, and burning at end of urination . ? ?OBJECTIVE:  ?There were no vitals taken for this visit.  ?Appears well, in no apparent distress ? ?No results found for this or any previous visit (from the past 24 hour(s)). ? ?ASSESSMENT: ?GYN patient with UTI symptoms and negative nitrites ? ?PLAN: ?Discussed with Dr. Nelda Marseille   ?Rx sent by provider today: No ?Urine culture sent ?Call or return to clinic prn if these symptoms worsen or fail to improve as anticipated. ?Follow-up: as needed  ? ?Johnaton Sonneborn A Etienne Millward  ?05/08/2021 ?9:31 AM  ?

## 2021-05-09 ENCOUNTER — Ambulatory Visit: Payer: No Typology Code available for payment source | Admitting: Physician Assistant

## 2021-05-09 LAB — CERVICOVAGINAL ANCILLARY ONLY
Bacterial Vaginitis (gardnerella): NEGATIVE
Candida Glabrata: NEGATIVE
Candida Vaginitis: NEGATIVE
Comment: NEGATIVE
Comment: NEGATIVE
Comment: NEGATIVE

## 2021-05-10 ENCOUNTER — Other Ambulatory Visit (HOSPITAL_COMMUNITY): Payer: Self-pay

## 2021-05-10 ENCOUNTER — Other Ambulatory Visit: Payer: Self-pay | Admitting: Obstetrics & Gynecology

## 2021-05-10 ENCOUNTER — Other Ambulatory Visit (HOSPITAL_BASED_OUTPATIENT_CLINIC_OR_DEPARTMENT_OTHER): Payer: Self-pay

## 2021-05-10 DIAGNOSIS — N3000 Acute cystitis without hematuria: Secondary | ICD-10-CM

## 2021-05-10 LAB — URINE CULTURE

## 2021-05-10 MED ORDER — NITROFURANTOIN MONOHYD MACRO 100 MG PO CAPS
100.0000 mg | ORAL_CAPSULE | Freq: Two times a day (BID) | ORAL | 0 refills | Status: AC
  Filled 2021-05-10 (×2): qty 14, 7d supply, fill #0

## 2021-05-10 NOTE — Progress Notes (Signed)
Rx for UTI sent in ?

## 2021-05-26 ENCOUNTER — Ambulatory Visit: Payer: No Typology Code available for payment source | Admitting: Adult Health

## 2021-05-26 ENCOUNTER — Encounter: Payer: Self-pay | Admitting: Adult Health

## 2021-05-26 VITALS — BP 141/86 | HR 75 | Ht 65.0 in | Wt 165.0 lb

## 2021-05-26 DIAGNOSIS — N939 Abnormal uterine and vaginal bleeding, unspecified: Secondary | ICD-10-CM | POA: Diagnosis not present

## 2021-05-26 DIAGNOSIS — Z87440 Personal history of urinary (tract) infections: Secondary | ICD-10-CM

## 2021-05-26 LAB — POCT URINALYSIS DIPSTICK
Blood, UA: NEGATIVE
Glucose, UA: NEGATIVE
Leukocytes, UA: NEGATIVE
Nitrite, UA: NEGATIVE
Protein, UA: NEGATIVE

## 2021-05-26 NOTE — Progress Notes (Signed)
?  Subjective:  ?  ? Patient ID: Mary Pham, female   DOB: 05-Mar-1985, 36 y.o.   MRN: [id number] ? ?HPI ?Tata is a 36 year old white female,married, G1P1 in for 3 month follow up on bleeding with IUD and has had none and did not take megace. Was treated 05/08/21 for UTI, + E Coli, and had UTI before that at [facility]. ?Not having any symptoms now but would like to make sure it is gone. ?Lab Results  ?Component Value Date  ? DIAGPAP  03/27/2019  ?  - Negative for intraepithelial lesion or malignancy (NILM)  ? [place] Negative 03/27/2019  ? PCP is Dr Quillian Quince. ? ?Review of Systems ?No bleeding since January ?Was treated for UTI 05/08/21 for E coli, no symptoms now ?Reviewed past medical,surgical, social and family history. Reviewed medications and allergies.  ?   ?Objective:  ? Physical Exam ?BP (!) 141/86 (BP Location: Left Arm, Patient Position: Sitting, Cuff Size: Normal)   Pulse 75   Ht '5\' 5"'$  (1.651 m)   Wt 165 lb (74.8 kg)   BMI 27.46 kg/m?  urine dipstick negative, ?Skin warm and dry. Lungs: clear to ausculation bilaterally. Cardiovascular: regular rate and rhythm.  ?  Fall risk is low ? [facility] - 05/26/21 0902   ? ?  ? Pregnancy Intention Screening  ? Does the patient want to become pregnant in the next year? No   ? Does the patient's partner want to become pregnant in the next year? No   ? Would the patient like to discuss contraceptive options today? No   ?  ? Contraception Wrap Up  ? Current Method IUD or IUS   ? End Method IUD or IUS   ? Contraception Counseling Provided No   ? ?  ?  ? ?  ?  ?Assessment:  ?   ?1. History of UTI ?Will get UA C&S for proof cure ?- Urinalysis, Routine w reflex microscopic ?- Urine Culture ? ?2. Abnormal uterine bleeding (AUB), resolved ?   ?Plan:  ?   ?Follow up prn  ?   ?

## 2021-05-27 LAB — URINALYSIS, ROUTINE W REFLEX MICROSCOPIC
Bilirubin, UA: NEGATIVE
Glucose, UA: NEGATIVE
Ketones, UA: NEGATIVE
Leukocytes,UA: NEGATIVE
Nitrite, UA: NEGATIVE
Protein,UA: NEGATIVE
RBC, UA: NEGATIVE
Specific Gravity, UA: 1.006 (ref 1.005–1.030)
Urobilinogen, Ur: 0.2 mg/dL (ref 0.2–1.0)
pH, UA: 6.5 (ref 5.0–7.5)

## 2021-05-29 LAB — URINE CULTURE: Organism ID, Bacteria: NO GROWTH

## 2021-06-01 ENCOUNTER — Other Ambulatory Visit (HOSPITAL_BASED_OUTPATIENT_CLINIC_OR_DEPARTMENT_OTHER): Payer: Self-pay

## 2021-06-01 ENCOUNTER — Ambulatory Visit (INDEPENDENT_AMBULATORY_CARE_PROVIDER_SITE_OTHER): Payer: No Typology Code available for payment source | Admitting: *Deleted

## 2021-06-01 ENCOUNTER — Telehealth: Payer: Self-pay | Admitting: Dermatology

## 2021-06-01 VITALS — Wt 165.0 lb

## 2021-06-01 DIAGNOSIS — Z5181 Encounter for therapeutic drug level monitoring: Secondary | ICD-10-CM

## 2021-06-01 DIAGNOSIS — L700 Acne vulgaris: Secondary | ICD-10-CM | POA: Diagnosis not present

## 2021-06-01 LAB — POCT URINE PREGNANCY: Preg Test, Ur: NEGATIVE

## 2021-06-01 MED ORDER — MINOCYCLINE HCL 100 MG PO CAPS
100.0000 mg | ORAL_CAPSULE | Freq: Two times a day (BID) | ORAL | 0 refills | Status: AC
  Filled 2021-06-01: qty 40, 20d supply, fill #0
  Filled 2021-06-01: qty 20, 10d supply, fill #0

## 2021-06-01 NOTE — Progress Notes (Signed)
Here to start isotretinoin per kelli sheffield- okay to give minocycline '100mg'$  # 60 for 31 days until able to start isotretinoin. Patinet registered in [place]. ?

## 2021-06-01 NOTE — Telephone Encounter (Signed)
Patient is calling to start isotretinoin ?

## 2021-06-02 ENCOUNTER — Encounter: Payer: Self-pay | Admitting: Plastic Surgery

## 2021-06-03 ENCOUNTER — Other Ambulatory Visit (HOSPITAL_COMMUNITY): Payer: Self-pay

## 2021-06-07 ENCOUNTER — Ambulatory Visit (INDEPENDENT_AMBULATORY_CARE_PROVIDER_SITE_OTHER): Payer: Self-pay | Admitting: Plastic Surgery

## 2021-06-07 DIAGNOSIS — Z411 Encounter for cosmetic surgery: Secondary | ICD-10-CM

## 2021-06-07 NOTE — Progress Notes (Signed)
Patient presents to discuss our plan for upcoming cosmetic surgery.  We were planning to do subglandular breast augmentation and potentially mastopexy to help with the skin envelope.  She is confident in that plan and is happy to move ahead as we discussed.  At her previous visit we also discussed an upper back lift combined with total back liposuction.  She is questioning whether or not to change the plan to a lower body lift and wants to talk through that.  She says that her priorities regarding her back are the upper back in the crease that formed in the lower back she is wondering if the lower body lift might affect the crease in the lower back more so.  On examination I did stimulate the skin pole of both operations and ultimately I think the upper back lift helps more with the lower midline crease then the lower body lift would.  I did explain that liposuctioning along the edges and across the midline might also help release some of the tethering.  Ultimately we decided to maintain the plan as we previously discussed for bilateral breast augmentation with mastopexy along with an upper back lift and back liposuction.  She is confident in this plan moving forward and all of her questions were answered. ?

## 2021-06-14 ENCOUNTER — Telehealth: Payer: No Typology Code available for payment source | Admitting: Plastic Surgery

## 2021-06-26 ENCOUNTER — Other Ambulatory Visit (HOSPITAL_BASED_OUTPATIENT_CLINIC_OR_DEPARTMENT_OTHER): Payer: Self-pay

## 2021-06-29 ENCOUNTER — Ambulatory Visit (INDEPENDENT_AMBULATORY_CARE_PROVIDER_SITE_OTHER): Payer: Self-pay | Admitting: Surgical

## 2021-06-29 ENCOUNTER — Other Ambulatory Visit (HOSPITAL_COMMUNITY): Payer: Self-pay

## 2021-06-29 ENCOUNTER — Encounter: Payer: Self-pay | Admitting: Surgical

## 2021-06-29 VITALS — BP 145/91 | HR 77 | Ht 65.0 in | Wt 162.0 lb

## 2021-06-29 DIAGNOSIS — Z411 Encounter for cosmetic surgery: Secondary | ICD-10-CM

## 2021-06-29 MED ORDER — OXYCODONE HCL 5 MG PO TABS
5.0000 mg | ORAL_TABLET | Freq: Four times a day (QID) | ORAL | 0 refills | Status: AC | PRN
  Filled 2021-06-29: qty 20, 5d supply, fill #0

## 2021-06-29 MED ORDER — ONDANSETRON HCL 4 MG PO TABS
4.0000 mg | ORAL_TABLET | Freq: Three times a day (TID) | ORAL | 0 refills | Status: DC | PRN
  Filled 2021-06-29: qty 20, 7d supply, fill #0

## 2021-06-29 NOTE — Progress Notes (Signed)
Patient ID: JADEA SHIFFER, female    DOB: September 21, 1985, 36 y.o.   MRN: [id number]  Chief Complaint  Patient presents with   Pre-op Exam      ICD-10-CM   1. Encounter for cosmetic procedure  Z41.1       History of Present Illness: DENNICE TINDOL is a 36 y.o.  female .  She presents for preoperative evaluation for upcoming procedure, Bilateral breast augmentation with left breast mastopexy, bilateral upper back lift with liposuction and liposuction of lower back, scheduled for 07/17/2021 with Dr. Claudia Desanctis.  The patient has not had problems with anesthesia. No history of DVT/PE.  No family history of DVT/PE.  No family or personal history of bleeding or clotting disorders.  Patient is not currently taking any blood thinners.  No history of CVA/MI.   Job: Surgical scheduler, works from home  Patient reports she is feeling well, little bit nervous for surgery.  No recent changes to her health.  She is excited for surgery, has some questions about what to expect.  She reports specifically that she would like to be around a 350 cc implant, would like to be a double D bra size, on the high side of this.  She would prefer a lot of projection and a lot of upper pole fullness.  She also would like to have a very natural feel if possible.  She previously discussed location of implant with Dr. Claudia Desanctis, upon EMR review and discussing with Dr. Claudia Desanctis discussed subglandular.  Past Medical History: Allergies: Allergies  Allergen Reactions   Guaifenesin-Codeine     Stomach cramping    Current Medications:  Current Outpatient Medications:    Clascoterone (WINLEVI) 1 % CREA, Apply to acne qhs, Disp: 60 g, Rfl: 6   levonorgestrel (MIRENA) 20 MCG/24HR IUD, 1 each by Intrauterine route once., Disp: , Rfl:    minocycline (MINOCIN) 100 MG capsule, Take 1 capsule (100 mg total) by mouth 2 (two) times daily., Disp: 60 capsule, Rfl: 0   ondansetron (ZOFRAN) 4 MG tablet, Take 1 tablet (4 mg total) by mouth every 8  (eight) hours as needed for nausea or vomiting., Disp: 20 tablet, Rfl: 0   oxyCODONE (OXY IR/ROXICODONE) 5 MG immediate release tablet, Take 1 tablet (5 mg total) by mouth every 6 (six) hours as needed for up to 5 days for severe pain., Disp: 20 tablet, Rfl: 0   triamcinolone cream (KENALOG) 0.1 %, Apply a thin layer to affected area 2 times a day, Disp: 80 g, Rfl: 2  Past Medical Problems: Past Medical History:  Diagnosis Date   Contraceptive education 10/09/2012   Hidradenitis suppurativa    IBS (irritable bowel syndrome)    Irregular bleeding 10/09/2012   Migraines    during pregnancy   Pregnancy induced hypertension    during pregnancy  7 yeasrs ago    Past Surgical History: Past Surgical History:  Procedure Laterality Date   CESAREAN SECTION  02/01/2012   Procedure: CESAREAN SECTION;  Surgeon: Guss Bunde, MD;  Location: [facility];  Service: Obstetrics;  Laterality: N/A;  Primary Cesarean Section Delivery Baby Boy @ 0221, Apgars 7/9   CHOLECYSTECTOMY     GASTRIC ROUX-EN-Y N/A 02/02/2019   Procedure: LAPAROSCOPIC ROUX-EN-Y GASTRIC BYPASS WITH UPPER ENDOSCOPY, Upper Endo-ERAS Pathway;  Surgeon: Johnathan Hausen, MD;  Location: [facility];  Service: General;  Laterality: N/A;   HYDRADENITIS EXCISION Bilateral 12/09/2020   Procedure: EXCISION HIDRADENITIS GROIN;  Surgeon: [name], MD;  Location: [facility];  Service: Plastics;  Laterality: Bilateral;   nexplanon implant  12/11/2018   in the office   PANNICULECTOMY N/A 03/15/2020   Procedure: ABDOMINAL PANNICULECTOMY;  Surgeon: Johnathan Hausen, MD;  Location: [facility];  Service: General;  Laterality: N/A;  ROOM 3 STARTING AT 01:15PM FOR 90 MIN   tummy tuck  05/2020    Social History: Social History   Socioeconomic History   Marital status: Married    Spouse name: Not on file   Number of children: Not on file   Years of education: Not on file   Highest education level: Not on file  Occupational History   Not  on file  Tobacco Use   Smoking status: Never   Smokeless tobacco: Never  Vaping Use   Vaping Use: Never used  Substance and Sexual Activity   Alcohol use: No   Drug use: No   Sexual activity: Yes    Birth control/protection: I.U.D.  Other Topics Concern   Not on file  Social History Narrative   Not on file   Social Determinants of Health   Financial Resource Strain: Not on file  Food Insecurity: Not on file  Transportation Needs: Not on file  Physical Activity: Not on file  Stress: Not on file  Social Connections: Not on file  Intimate Partner Violence: Not on file    Family History: Family History  Problem Relation Age of Onset   Hypertension Father    Diabetes Father    Heart disease Maternal Grandmother    Heart disease Maternal Grandfather    Cancer Paternal Grandmother        lung   Stroke Paternal Grandfather     Review of Systems: Review of Systems  Constitutional: Negative.   Respiratory: Negative.    Cardiovascular: Negative.   Gastrointestinal: Negative.   Neurological: Negative.     Physical Exam: Vital Signs BP (!) 145/91 (BP Location: Left Arm, Patient Position: Sitting, Cuff Size: Normal)   Pulse 77   Ht '5\' 5"'$  (1.651 m)   Wt 162 lb (73.5 kg)   SpO2 99%   BMI 26.96 kg/m   Physical Exam  Constitutional:      General: Not in acute distress.    Appearance: Normal appearance. Not ill-appearing.  HENT:     Head: Normocephalic and atraumatic.  Eyes:     Pupils: Pupils are equal, round Neck:     Musculoskeletal: Normal range of motion.  Cardiovascular:     Rate and Rhythm: Normal rate    Pulses: Normal pulses.  Pulmonary:     Effort: Pulmonary effort is normal. No respiratory distress.  Abdominal:     General: Abdomen is flat. There is no distension.  Musculoskeletal: Normal range of motion.  Skin:    General: Skin is warm and dry.     Findings: No erythema or rash.  Neurological:     General: No focal deficit present.     Mental  Status: Alert and oriented to person, place, and time. Mental status is at baseline.     Motor: No weakness.  Psychiatric:        Mood and Affect: Mood normal.        Behavior: Behavior normal.    Assessment/Plan: The patient is scheduled for bilateral breast augmentation and left breast mastopexy, bilateral upper back lift with liposuction and liposuction of lower back with Dr. Claudia Desanctis.  Risks, benefits, and alternatives of procedure discussed, questions answered and consent obtained.  Smoking Status: Non-smoker; Counseling Given?  N/A Last Mammogram: No mammographic history per EMR review; Results: N/A  Caprini Score: 3, moderate; Risk Factors include: Age, BMI greater than 25, and length of planned surgery. Recommendation for mechanical prophylaxis. Encourage early ambulation.   Pictures obtained: Pictures taken today and placed in the patient's chart with patient's permission  Post-op Rx sent to pharmacy: Oxycodone, Zofran  Patient was provided with the General Surgical Risk consent document and Pain Medication Agreement prior to their appointment.  They had adequate time to read through the risk consent documents and Pain Medication Agreement. We also discussed them in person together during this preop appointment. All of their questions were answered to their satisfaction.  Recommended calling if they have any further questions.  Risk consent form and Pain Medication Agreement to be scanned into patient's chart.  Patient was provided with the [employer] implant patient decision checklist and this was completed during today's preoperative evaluation. Patient had time to read through the information and any questions were answered to their content. Form will be scanned into patient's chart.  The risks that can be encountered with and after placement of a breast implant were discussed and include the following but not limited to these: bleeding, infection, delayed healing, anesthesia risks,  skin sensation changes, injury to structures including nerves, blood vessels, and muscles which may be temporary or permanent, allergies to tape, suture materials and glues, blood products, topical preparations or injected agents, skin contour irregularities, skin discoloration and swelling, deep vein thrombosis, cardiac and pulmonary complications, pain, which may persist, fluid accumulation, wrinkling of the skin over the implanmt, changes in nipple or breast sensation, implant leakage or rupture, faulty position of the implant, persistent pain, formation of tight scar tissue around the implant (capsular contracture).  The risk that can be encountered with mastopexy/breast lift were discussed and include the following but not limited to these:  Breast asymmetry, fluid accumulation, firmness of the breast, inability to breast feed, loss of nipple or areola, skin loss, decrease or no nipple sensation, fat necrosis of the breast tissue, bleeding, infection, healing delay.  There are risks of anesthesia, changes to skin sensation and injury to nerves or blood vessels.  The muscle can be temporarily or permanently injured.  You may have an allergic reaction to tape, suture, glue, blood products which can result in skin discoloration, swelling, pain, skin lesions, poor healing.  Any of these can lead to the need for revisonal surgery or stage procedures.  A mastopexy has potential to interfere with diagnostic procedures.  Nipple or breast piercing can increase risks of infection.  This procedure is best done when the breast is fully developed.  Changes in the breast will continue to occur over time.  Pregnancy can alter the outcomes of previous breast surgery, weight gain and weigh loss can also effect the long term appearance.   The risks that can be encountered with and after liposuction were discussed and include the following but no limited to these:  Asymmetry, fluid accumulation, firmness of the area, fat  necrosis with death of fat tissue, bleeding, infection, delayed healing, anesthesia risks, skin sensation changes, injury to structures including nerves, blood vessels, and muscles which may be temporary or permanent, allergies to tape, suture materials and glues, blood products, topical preparations or injected agents, skin and contour irregularities, skin discoloration and swelling, deep vein thrombosis, cardiac and pulmonary complications, pain, which may persist, persistent pain, recurrence of the lesion, poor healing of the incision, possible  need for revisional surgery or staged procedures. Thiere can also be persistent swelling, poor wound healing, rippling or loose skin, worsening of cellulite, swelling, and thermal burn or heat injury from ultrasound with the ultrasound-assisted lipoplasty technique. Any change in weight fluctuations can alter the outcome.   Electronically signed by: Carola Rhine Roslyn Else, PA-C 06/29/2021 3:39 PM

## 2021-07-03 ENCOUNTER — Other Ambulatory Visit (HOSPITAL_COMMUNITY): Payer: Self-pay

## 2021-07-04 ENCOUNTER — Ambulatory Visit: Payer: No Typology Code available for payment source | Admitting: Physician Assistant

## 2021-07-17 DIAGNOSIS — Z411 Encounter for cosmetic surgery: Secondary | ICD-10-CM

## 2021-07-20 ENCOUNTER — Telehealth: Payer: Self-pay

## 2021-07-20 NOTE — Telephone Encounter (Signed)
Pt had surgery on 07/17/21-back lift area is itching so bad it is keeping her up at night. I told her allergy medicine during the day/benadryl at night. I just wanted to verify with you, also see if you have any other suggestions.

## 2021-07-20 NOTE — Telephone Encounter (Signed)
Relayed information to patient. She conveyed understanding and will call if any rash is noticed.

## 2021-07-21 ENCOUNTER — Other Ambulatory Visit (INDEPENDENT_AMBULATORY_CARE_PROVIDER_SITE_OTHER): Payer: Self-pay | Admitting: Physician Assistant

## 2021-07-21 ENCOUNTER — Encounter: Payer: Self-pay | Admitting: Plastic Surgery

## 2021-07-21 DIAGNOSIS — Z411 Encounter for cosmetic surgery: Secondary | ICD-10-CM

## 2021-07-21 MED ORDER — CEPHALEXIN 500 MG PO CAPS
500.0000 mg | ORAL_CAPSULE | Freq: Three times a day (TID) | ORAL | 0 refills | Status: AC
  Filled 2021-07-21: qty 15, 5d supply, fill #0

## 2021-07-21 NOTE — Progress Notes (Signed)
Patient called-in after hours on 07/21/2021  with concern for infection. She tells me that she had been experiencing significant pruritis at site of upper back closures yesterday, improved with antihistamines.  She removed her bandages and today noticed red rash where her dressings had been.  Describes blistering. States that she has been working all week, good energy levels, eating and drinking OK, denies any fevers or systemic symptoms.  Based on HPI, suspect tape dermatitis. However, discussed coverage for early cellulitis given tenderness, redness, blistering (which while likely reflective of dermatitis can still be seen in cases of infection.  Will prescribe Keflex and have her follow-up in clinic first thing next week when office re-opens.    She will call back should she experience any new or worsening symptoms in interim.

## 2021-07-22 ENCOUNTER — Other Ambulatory Visit (HOSPITAL_COMMUNITY): Payer: Self-pay

## 2021-07-27 ENCOUNTER — Ambulatory Visit (INDEPENDENT_AMBULATORY_CARE_PROVIDER_SITE_OTHER): Payer: Self-pay | Admitting: Plastic Surgery

## 2021-07-27 ENCOUNTER — Encounter: Payer: Self-pay | Admitting: Plastic Surgery

## 2021-07-27 DIAGNOSIS — Z411 Encounter for cosmetic surgery: Secondary | ICD-10-CM

## 2021-07-27 NOTE — Progress Notes (Signed)
Patient presents postop from upper back lift and lower back liposuction combined with bilateral breast augmentation with a left sided mastopexy.  She did have some irritation from what I believe was the tape used for her dressings but since that has been removed she feels like things are improving.  On exam all of her incisions look intact.  There is a small separation for about a centimeter on the back incision on the left side but continues to be well approximated.  She has a great shape and contour regarding the breast portion and left nipple areolar complex looks viable.  We will plan to continue compressive garments and avoid strenuous activity and see her at her next visit in 2 weeks.  All of her questions were answered.

## 2021-07-31 NOTE — Telephone Encounter (Signed)
Called patient to discuss her concerns.  We discussed she has an upcoming appointment on 08/15/2021.  She reports that she would like to be scheduled with me at the following visit, I discussed with her that would not be an issue.  We also discussed that her rash on her back has significantly improved and is feeling much better.  She reports she has some questions about nipple areola size.  She is otherwise feeling well, reports she will call with any further questions or concerns.

## 2021-08-08 ENCOUNTER — Ambulatory Visit: Payer: No Typology Code available for payment source | Admitting: Physician Assistant

## 2021-08-09 ENCOUNTER — Encounter: Payer: No Typology Code available for payment source | Admitting: Surgical

## 2021-08-10 ENCOUNTER — Encounter: Payer: No Typology Code available for payment source | Admitting: Surgical

## 2021-08-14 NOTE — Progress Notes (Unsigned)
Patient is a 36 year old female who underwent bilateral upper back lift with lower back liposuction, bilateral breast augmentation and left mastopexy with Dr. Claudia Desanctis on 07/17/2021.  Patient is approximately 1 month postop.  She presents to the clinic for follow-up.  Patient was last seen in the clinic on 07/27/2021.  At this visit, patient reported she had some irritation from the tape that was used for her dressings, and reported that she felt like things were improving.  On exam, her incisions were intact.  Plan was to reevaluate the patient in 2 weeks.  Today,

## 2021-08-15 ENCOUNTER — Ambulatory Visit (INDEPENDENT_AMBULATORY_CARE_PROVIDER_SITE_OTHER): Payer: Self-pay | Admitting: Surgical

## 2021-08-15 DIAGNOSIS — L987 Excessive and redundant skin and subcutaneous tissue: Secondary | ICD-10-CM

## 2021-08-15 DIAGNOSIS — L732 Hidradenitis suppurativa: Secondary | ICD-10-CM

## 2021-08-15 DIAGNOSIS — Z411 Encounter for cosmetic surgery: Secondary | ICD-10-CM

## 2021-08-25 ENCOUNTER — Encounter (HOSPITAL_COMMUNITY): Payer: Self-pay | Admitting: *Deleted

## 2021-09-14 ENCOUNTER — Ambulatory Visit: Payer: No Typology Code available for payment source | Admitting: Physician Assistant

## 2021-10-16 ENCOUNTER — Ambulatory Visit (INDEPENDENT_AMBULATORY_CARE_PROVIDER_SITE_OTHER): Payer: Self-pay | Admitting: Plastic Surgery

## 2021-10-16 DIAGNOSIS — Z411 Encounter for cosmetic surgery: Secondary | ICD-10-CM

## 2021-10-16 NOTE — Progress Notes (Signed)
   Referring Provider Caryl Bis, MD [place],  [place] [address]   CC:  Dogears from flank excision and arms.  Mary Pham is an 36 y.o. female.  HPI: Patient is a 36 year old who has some dogears in her axilla after excision of flanks by Dr. Claudia Desanctis.  She is also interested in possible brachioplasty  She notes that she has some hidradenitis but this does not appear overly active in her axilla currently.  Review of Systems General: No fever or chills.  Physical Exam    06/29/2021    2:45 PM 06/01/2021   12:00 PM 05/26/2021    8:58 AM  Vitals with BMI  Height '5\' 5"'$   '5\' 5"'$   Weight 162 lbs 165 lbs 165 lbs  BMI 26.96 99.83 38.25  Systolic 053  976  Diastolic 91  86  Pulse 77  75    General:  No acute distress,  Alert and oriented, Non-Toxic, Normal speech and affect Extremity: Bilateral excess skin arms.  Small dogears axilla.  Assessment/Plan Discussed with patient that we could excise dogears in office under local.  Normally I would wait longer but I think that it is likely that these will not flatten so we can do this soon.  Brachioplasty would be in the operating room we will give her a quote from this.  Lennice Sites 10/16/2021, 3:19 PM

## 2021-10-18 ENCOUNTER — Encounter: Payer: Self-pay | Admitting: *Deleted

## 2021-11-02 ENCOUNTER — Ambulatory Visit (INDEPENDENT_AMBULATORY_CARE_PROVIDER_SITE_OTHER): Payer: Self-pay | Admitting: Plastic Surgery

## 2021-11-02 VITALS — BP 139/83 | HR 84 | Temp 98.2°F | Resp 16 | Ht 65.0 in | Wt 175.0 lb

## 2021-11-02 DIAGNOSIS — Z411 Encounter for cosmetic surgery: Secondary | ICD-10-CM

## 2021-11-05 NOTE — Progress Notes (Signed)
Operative Note   DATE OF OPERATION: 11/05/2021  LOCATION:   Office  SURGICAL DEPARTMENT: Plastic Surgery  PREOPERATIVE DIAGNOSES:   Standing cone deformity bilateral axilla  POSTOPERATIVE DIAGNOSES:  same  PROCEDURE:  Excision right axilla standing cone deformity with 1.5 cm complex closure Excision left axilla; deformity with 1.5 cm complex closure   SURGEON: Clarence Dunsmore P. Lisabeth Mian, MD  ANESTHESIA:  Local  COMPLICATIONS: None.   INDICATIONS FOR PROCEDURE:  The patient, Mary Pham is a 36 y.o. female born on [birth year], is here for treatment of standing cone deformity bilateral axilla.  It is very subtle. MRN: [id number]  CONSENT:  Informed consent was obtained directly from the patient. Risks, benefits and alternatives were fully discussed. Specific risks including but not limited to bleeding, infection, hematoma, seroma, scarring, pain, infection, wound healing problems, and need for further surgery were all discussed. The patient did have an ample opportunity to have questions answered to satisfaction.   DESCRIPTION OF PROCEDURE:  Local anesthesia was administered. The patient's operative site was prepped and draped in a sterile fashion. A time out was performed and all information was confirmed to be correct.  The lesion standing cone deformities were excised using elliptical incision with a 15 blade.  After confirming hemostasis with Bovie electrocautery they were closed with interrupted PDS sutures followed by running Prolene suture.  Contour was improved.  The patient tolerated the procedure well.  There were no complications.

## 2021-11-14 ENCOUNTER — Ambulatory Visit (INDEPENDENT_AMBULATORY_CARE_PROVIDER_SITE_OTHER): Payer: Self-pay | Admitting: Surgical

## 2021-11-14 DIAGNOSIS — Z719 Counseling, unspecified: Secondary | ICD-10-CM

## 2021-11-14 NOTE — Progress Notes (Signed)
Patient is a 36 year old female here for follow-up after excision of right and left axilla standing cone deformities with Dr. Erin Hearing on 11/02/2021.  Incisions were closed with Prolene.  She is approximately 2 weeks postop.  Patient reports she is doing well.  She is not having any issues.  Chaperone present on exam On exam bilateral axillary incisions are intact, healing well.  Prolene sutures in place.  No erythema or cellulitic changes noted.  No active drainage noted.  Standing cone deformities are improved.  Assessment/plan:  Prolene sutures were removed.  Patient tolerated this well.  Recommend following up as needed, call with questions or concerns.  No signs of infection on exam.

## 2021-11-30 ENCOUNTER — Ambulatory Visit: Payer: No Typology Code available for payment source | Admitting: Plastic Surgery

## 2022-03-09 ENCOUNTER — Telehealth: Payer: Self-pay | Admitting: Orthopaedic Surgery

## 2022-03-09 DIAGNOSIS — M25572 Pain in left ankle and joints of left foot: Secondary | ICD-10-CM | POA: Diagnosis not present

## 2022-03-09 DIAGNOSIS — S93402A Sprain of unspecified ligament of left ankle, initial encounter: Secondary | ICD-10-CM | POA: Diagnosis not present

## 2022-03-09 NOTE — Telephone Encounter (Signed)
Patient called, stated that she hurt her ankle this morning and wanted to be seen today.  Advised that we could see her next Wednesday, she declined stating she felt she needed to be seen before then.  She stated she may go to the Urgent Care.  I did advise if she went somewhere other then [facility] to be sure to leave with her notes and CD.

## 2022-03-19 ENCOUNTER — Other Ambulatory Visit: Payer: 59

## 2022-03-19 ENCOUNTER — Encounter: Payer: Self-pay | Admitting: Obstetrics & Gynecology

## 2022-03-19 ENCOUNTER — Other Ambulatory Visit (HOSPITAL_COMMUNITY)
Admission: RE | Admit: 2022-03-19 | Discharge: 2022-03-19 | Disposition: A | Discharge status: Home / Self Care | Payer: 59 | Source: Ambulatory Visit | Attending: Obstetrics & Gynecology | Admitting: Obstetrics & Gynecology

## 2022-03-19 ENCOUNTER — Ambulatory Visit (INDEPENDENT_AMBULATORY_CARE_PROVIDER_SITE_OTHER): Payer: 59 | Admitting: Obstetrics & Gynecology

## 2022-03-19 VITALS — BP 134/80 | HR 70 | Ht 65.0 in | Wt 191.0 lb

## 2022-03-19 DIAGNOSIS — Z01419 Encounter for gynecological examination (general) (routine) without abnormal findings: Secondary | ICD-10-CM | POA: Diagnosis not present

## 2022-03-19 DIAGNOSIS — N898 Other specified noninflammatory disorders of vagina: Secondary | ICD-10-CM

## 2022-03-19 DIAGNOSIS — Z1272 Encounter for screening for malignant neoplasm of vagina: Secondary | ICD-10-CM | POA: Insufficient documentation

## 2022-03-19 DIAGNOSIS — Z87440 Personal history of urinary (tract) infections: Secondary | ICD-10-CM

## 2022-03-19 LAB — POCT URINALYSIS DIPSTICK
Bilirubin, UA: NEGATIVE
Blood, UA: NEGATIVE
Glucose, UA: NEGATIVE
Ketones, UA: NEGATIVE
Leukocytes, UA: NEGATIVE
Nitrite, UA: NEGATIVE
Protein, UA: NEGATIVE
Spec Grav, UA: 1.02 (ref 1.010–1.025)
Urobilinogen, UA: 0.2 E.U./dL
pH, UA: 5.5 (ref 5.0–8.0)

## 2022-03-19 NOTE — Progress Notes (Signed)
WELL-WOMAN EXAMINATION Patient name: Mary Pham MRN [id number]  Date of birth: 06-Jan-1986 Chief Complaint:   Gynecologic Exam  History of Present Illness:   Mary Pham is a 37 y.o. G8P1001  female being seen today for a routine well-woman exam.   Occasional bleeding after intercourse.  Brown spotting last for a few days.  No regular periods.  Cloudy urine and odor- no urgency or urgency.  No hematuria.  No vaginal discharge.  Denies pelvic or abdominal pain.  No LMP recorded. (Menstrual status: IUD).  Same partner The current method of family planning is IUD.    Last pap 20211.  Last mammogram: n/a. Last colonoscopy: n/a     03/19/2022    2:21 PM 03/27/2019    9:00 AM 12/12/2018    8:35 AM 11/28/2018    9:17 AM  Depression screen PHQ 2/9  Decreased Interest 0 0 0 0  Down, Depressed, Hopeless 0 0 0 0  PHQ - 2 Score 0 0 0 0  Altered sleeping 0     Tired, decreased energy 0     Change in appetite 0     Feeling bad or failure about yourself  0     Trouble concentrating 0     Moving slowly or fidgety/restless 0     Suicidal thoughts 0     PHQ-9 Score 0         Review of Systems:   Pertinent items are noted in HPI Denies any headaches, blurred vision, fatigue, shortness of breath, chest pain, abdominal pain, bowel movements, urination, or intercourse unless otherwise stated above.  Pertinent History Reviewed:  Reviewed past medical,surgical, social and family history.  Reviewed problem list, medications and allergies. Physical Assessment:   Vitals:   03/19/22 1407  BP: 134/80  Pulse: 70  Weight: 191 lb (86.6 kg)  Height: '5\' 5"'$  (1.651 m)  Body mass index is 31.78 kg/m.        Physical Examination:   General appearance - well appearing, and in no distress  Mental status - alert, oriented to person, place, and time  Psych:  She has a normal mood and affect  Skin - warm and dry, normal color, no suspicious lesions noted  Chest - effort normal, all lung fields  clear to auscultation bilaterally  Heart - normal rate and regular rhythm  Neck:  midline trachea, no thyromegaly or nodules  Breasts - breasts appear normal, no suspicious masses, no skin or nipple changes or  axillary nodes  Abdomen - soft, nontender, nondistended, no masses or organomegaly  Pelvic - VULVA: normal appearing vulva with no masses, tenderness or lesions  VAGINA: normal appearing vagina with normal color and discharge, no lesions  CERVIX: normal appearing cervix without discharge or lesions, no CMT, short strings noted.  Vaginal spotting noted with manipulation  Thin prep pap is done with HR HPV cotesting  UTERUS: uterus is felt to be normal size, shape, consistency and nontender   ADNEXA: No adnexal masses or tenderness noted.  Extremities:  No swelling or varicosities noted  Chaperone:  Mickey Farber      Assessment & Plan:  1) Well-Woman Exam -pap collected, reviewed ASCCP guidelines -postcoital bleeding- next step pending pap  2) Recurrent BV/vaginitis -UA negative -await results of pap for treatment.  IF POSITIVE- plan for Metrogel x 5 days then weekly x 2 mos IF NEGATIVE will send in Metrogel to take as needed   Meds: No orders of the defined types were  placed in this encounter.   Follow-up: Return in about 1 year (around 03/20/2023) for Annual.   Mary Pupa, DO Attending [place], [place] for [facility], [place]

## 2022-03-22 ENCOUNTER — Encounter: Payer: Self-pay | Admitting: Radiology

## 2022-03-26 ENCOUNTER — Telehealth: Payer: Self-pay | Admitting: Obstetrics & Gynecology

## 2022-03-26 LAB — CYTOLOGY - PAP
Comment: NEGATIVE
Diagnosis: NEGATIVE
Diagnosis: REACTIVE
High risk HPV: NEGATIVE

## 2022-03-26 NOTE — Telephone Encounter (Signed)
Pt aware that paps can take 7-10 days to come back. She was seen the afternoon of 2/26 so pap may not have went out until Tuesday 2/27. Pt aware and voiced understanding. [place]

## 2022-03-26 NOTE — Telephone Encounter (Signed)
Patient is concerned about her cytology report results not being back yet. Please advise.

## 2022-03-30 ENCOUNTER — Other Ambulatory Visit (HOSPITAL_COMMUNITY): Payer: Self-pay

## 2022-04-02 ENCOUNTER — Telehealth: Payer: Self-pay | Admitting: Obstetrics & Gynecology

## 2022-04-02 NOTE — Telephone Encounter (Signed)
Patient called requesting refill on Metrogel. States she normally gets a refill with additional refills.  Also requesting Minocycline '100mg'$  twice daily for acne be sent in a well.  States her dermatologist is no longer working and she has reached out to several dermatology offices and does not have a new patient appt for several months. Advised to check back with her pharmacy later today if she did not hear from our office.

## 2022-04-02 NOTE — Telephone Encounter (Signed)
Patient called to check status of metrogel refill and see if you would prescribe minocyclin. Her previous dermatologist had prescribed that but has since closed and she is in the process of finding a new dermatologist. Please advise.

## 2022-04-23 ENCOUNTER — Other Ambulatory Visit (HOSPITAL_BASED_OUTPATIENT_CLINIC_OR_DEPARTMENT_OTHER): Payer: Self-pay

## 2022-04-23 ENCOUNTER — Ambulatory Visit: Payer: 59 | Admitting: Dermatology

## 2022-04-23 ENCOUNTER — Encounter: Payer: Self-pay | Admitting: Dermatology

## 2022-04-23 VITALS — BP 135/88

## 2022-04-23 DIAGNOSIS — Z86018 Personal history of other benign neoplasm: Secondary | ICD-10-CM | POA: Diagnosis not present

## 2022-04-23 DIAGNOSIS — D1801 Hemangioma of skin and subcutaneous tissue: Secondary | ICD-10-CM

## 2022-04-23 DIAGNOSIS — D225 Melanocytic nevi of trunk: Secondary | ICD-10-CM

## 2022-04-23 DIAGNOSIS — L700 Acne vulgaris: Secondary | ICD-10-CM | POA: Diagnosis not present

## 2022-04-23 DIAGNOSIS — W908XXA Exposure to other nonionizing radiation, initial encounter: Secondary | ICD-10-CM | POA: Diagnosis not present

## 2022-04-23 DIAGNOSIS — X32XXXA Exposure to sunlight, initial encounter: Secondary | ICD-10-CM | POA: Diagnosis not present

## 2022-04-23 DIAGNOSIS — L578 Other skin changes due to chronic exposure to nonionizing radiation: Secondary | ICD-10-CM | POA: Diagnosis not present

## 2022-04-23 DIAGNOSIS — L821 Other seborrheic keratosis: Secondary | ICD-10-CM

## 2022-04-23 DIAGNOSIS — L814 Other melanin hyperpigmentation: Secondary | ICD-10-CM | POA: Diagnosis not present

## 2022-04-23 DIAGNOSIS — Z1283 Encounter for screening for malignant neoplasm of skin: Secondary | ICD-10-CM

## 2022-04-23 MED ORDER — TRETINOIN 0.025 % EX CREA
TOPICAL_CREAM | CUTANEOUS | 2 refills | Status: AC
  Filled 2022-04-23 (×2): qty 45, 30d supply, fill #0
  Filled 2023-04-14: qty 45, 30d supply, fill #1

## 2022-04-23 MED ORDER — MINOCYCLINE HCL 100 MG PO CAPS
100.0000 mg | ORAL_CAPSULE | Freq: Every day | ORAL | 2 refills | Status: AC
  Filled 2022-04-23 – 2022-05-08 (×3): qty 21, 21d supply, fill #0
  Filled 2022-12-06: qty 21, 21d supply, fill #1
  Filled 2023-04-14: qty 21, 21d supply, fill #2

## 2022-04-23 NOTE — Patient Instructions (Addendum)
Counseling I counseled the patient regarding the following: Skin care: I discussed with the patient the impo/rtance of using cleansers, moisturizers and cosmetics that are non-comedogenic. Expectations: The patient is aware that it may take up to 2-3 months to see a 60-80% improvement of acne. Contact office if: Acne worsens or fails to improve despite months of treatment; patient develops new scars, significantly more nodules or cysts. I recommended the following: Start Tretinoin 0.025% cream 3 nights a week, Monday, Wednesday, Friday, pea size amount to face Start Minocycline 100mg  1 pill a day for 7 days as needed for flares  Medication Counseling Retinoid Counseling: Patient advised to apply a pea-sized amount only at bedtime and wait 30 minutes after washing their face before applying. If too drying, patient may add a non-comedogenic moisturizer. The patient verbalized understanding of the proper use and possible adverse effects of retinoids. All of the patient's questions and concerns were addressed.    Due to recent changes in healthcare laws, you may see results of your pathology and/or laboratory studies on MyChart before the doctors have had a chance to review them. We understand that in some cases there may be results that are confusing or concerning to you. Please understand that not all results are received at the same time and often the doctors may need to interpret multiple results in order to provide you with the best plan of care or course of treatment. Therefore, we ask that you please give [place] 2 business days to thoroughly review all your results before contacting the office for clarification. Should we see a critical lab result, you will be contacted sooner.   If You Need Anything After Your Visit  If you have any questions or concerns for your doctor, please call our main line at [phone] If no one answers, please leave a voicemail as directed and we will return your call  as soon as possible. Messages left after 4 pm will be answered the following business day.   You may also send [place] a message via [place]. We typically respond to MyChart messages within 1-2 business days.  For prescription refills, please ask your pharmacy to contact our office. Our fax number is [phone].  If you have an urgent issue when the clinic is closed that cannot wait until the next business day, you can page your doctor at the number below.    Please note that while we do our best to be available for urgent issues outside of office hours, we are not available 24/7.   If you have an urgent issue and are unable to reach [place], you may choose to seek medical care at your doctor's office, retail clinic, urgent care center, or emergency room.  If you have a medical emergency, please immediately call 911 or go to the emergency department. In the event of inclement weather, please call our main line at [phone] for an update on the status of any delays or closures.  Dermatology Medication Tips: Please keep the boxes that topical medications come in in order to help keep track of the instructions about where and how to use these. Pharmacies typically print the medication instructions only on the boxes and not directly on the medication tubes.   If your medication is too expensive, please contact our office at [phone] or send [place] a message through [place].   We are unable to tell what your co-pay for medications will be in advance as this is different depending on your insurance coverage. However,  we may be able to find a substitute medication at lower cost or fill out paperwork to get insurance to cover a needed medication.   If a prior authorization is required to get your medication covered by your insurance company, please allow [place] 1-2 business days to complete this process.  Drug prices often vary depending on where the prescription is filled and some pharmacies may offer  cheaper prices.  The website www.goodrx.com contains coupons for medications through different pharmacies. The prices here do not account for what the cost may be with help from insurance (it may be cheaper with your insurance), but the website can give you the price if you did not use any insurance.  - You can print the associated coupon and take it with your prescription to the pharmacy.  - You may also stop by our office during regular business hours and pick up a GoodRx coupon card.  - If you need your prescription sent electronically to a different pharmacy, notify our office through [facility] or by phone at [phone]

## 2022-04-23 NOTE — Progress Notes (Unsigned)
New Patient Visit   Subjective  Mary Pham is a 37 y.o. female who presents for the following: Skin Cancer Screening and Full Body Skin Exam,hx of Dysplastic Nevi, Spitz nevus, fhx of BCC/SCC, Acne, face, has had for yrs, in past on Minocycline 100mg  bid, winlevi, check spot R clavicle, 3-63m, no symptoms  The patient presents for Total-Body Skin Exam (TBSE) for skin cancer screening and mole check. The patient has spots, moles and lesions to be evaluated, some may be new or changing and the patient has concerns that these could be cancer.    The following portions of the chart were reviewed this encounter and updated as appropriate: medications, allergies, medical history  Review of Systems:  No other skin or systemic complaints except as noted in HPI or Assessment and Plan.  Objective  Well appearing patient in no apparent distress; mood and affect are within normal limits.  A full examination was performed including scalp, head, eyes, ears, nose, lips, neck, chest, axillae, abdomen, back, buttocks, bilateral upper extremities, bilateral lower extremities, hands, feet, fingers, toes, fingernails, and toenails. All findings within normal limits unless otherwise noted below.   Relevant physical exam findings are noted in the Assessment and Plan.    Assessment & Plan   LENTIGINES, SEBORRHEIC KERATOSES, HEMANGIOMAS - Benign normal skin lesions - Benign-appearing - Call for any changes  MELANOCYTIC NEVI - Tan-brown and/or pink-flesh-colored symmetric macules and papules - Benign appearing on exam today - Observation - Call clinic for new or changing moles - Recommend daily use of broad spectrum spf 30+ sunscreen to sun-exposed areas.   ACTINIC DAMAGE - Chronic condition, secondary to cumulative UV/sun exposure - diffuse scaly erythematous macules with underlying dyspigmentation - Recommend daily broad spectrum sunscreen SPF 30+ to sun-exposed areas, reapply every 2 hours as  needed.  - Staying in the shade or wearing long sleeves, sun glasses (UVA+UVB protection) and wide brim hats (4-inch brim around the entire circumference of the hat) are also recommended for sun protection.  - Call for new or changing lesions.  SKIN CANCER SCREENING PERFORMED TODAY.  SEBORRHEIC KERATOSIS - Stuck-on, waxy, tan-brown papules and/or plaques  - Benign-appearing - Discussed benign etiology and prognosis. - Observe - Call for any changes - R clavicle  ACNE VULGARIS Exam: comedones face    Counseling I counseled the patient regarding the following: Skin care: I discussed with the patient the impo/rtance of using cleansers, moisturizers and cosmetics that are non-comedogenic. Expectations: The patient is aware that it may take up to 2-3 months to see a 60-80% improvement of acne. Contact office if: Acne worsens or fails to improve despite months of treatment; patient develops new scars, significantly more nodules or cysts. I recommended the following: Start Tretinoin 0.025% cr 3 nights a week, Monday, Wednesday, Friday Start Minocycline 100mg  1 po qd for 1 week prn flares Drawbrid  Medication Counseling Retinoid Counseling: Patient advised to apply a pea-sized amount only at bedtime and wait 30 minutes after washing their face before applying. If too drying, patient may add a non-comedogenic moisturizer. The patient verbalized understanding of the proper use and possible adverse effects of retinoids. All of the patient's questions and concerns were addressed.   HISTORY OF DYSPLASTIC NEVI and SPITZ NEVUS - No evidence of recurrence today - Recommend regular full body skin exams - Recommend daily broad spectrum sunscreen SPF 30+ to sun-exposed areas, reapply every 2 hours as needed.  - Call if any new or changing lesions are noted  between office visits   Return in about 3 months (around 07/23/2022).  I, Othelia Pulling, RMA, am acting as scribe for Ellard Artis, MD  .   Documentation: I have reviewed the above documentation for accuracy and completeness, and I agree with the above.  Ellard Artis, MD

## 2022-04-24 ENCOUNTER — Other Ambulatory Visit: Payer: Self-pay

## 2022-04-24 ENCOUNTER — Other Ambulatory Visit (HOSPITAL_COMMUNITY): Payer: Self-pay

## 2022-04-26 ENCOUNTER — Other Ambulatory Visit: Payer: Self-pay

## 2022-05-01 ENCOUNTER — Other Ambulatory Visit: Payer: Self-pay

## 2022-05-08 ENCOUNTER — Other Ambulatory Visit (HOSPITAL_COMMUNITY): Payer: Self-pay

## 2022-07-23 ENCOUNTER — Ambulatory Visit: Payer: 59 | Admitting: Dermatology

## 2022-08-23 ENCOUNTER — Encounter (HOSPITAL_COMMUNITY): Payer: Self-pay | Admitting: *Deleted

## 2022-09-28 DIAGNOSIS — M25551 Pain in right hip: Secondary | ICD-10-CM | POA: Diagnosis not present

## 2022-12-06 ENCOUNTER — Other Ambulatory Visit (HOSPITAL_COMMUNITY): Payer: Self-pay

## 2023-03-16 IMAGING — DX DG SHOULDER 2+V*L*
3 series · 3 of 3 positions shown · non-contrast
Comparison: None

CLINICAL DATA: LEFT shoulder injury, pain that began 3 weeks ago.

EXAM:
LEFT SHOULDER - 2+ VIEW

[shoulder grashey]
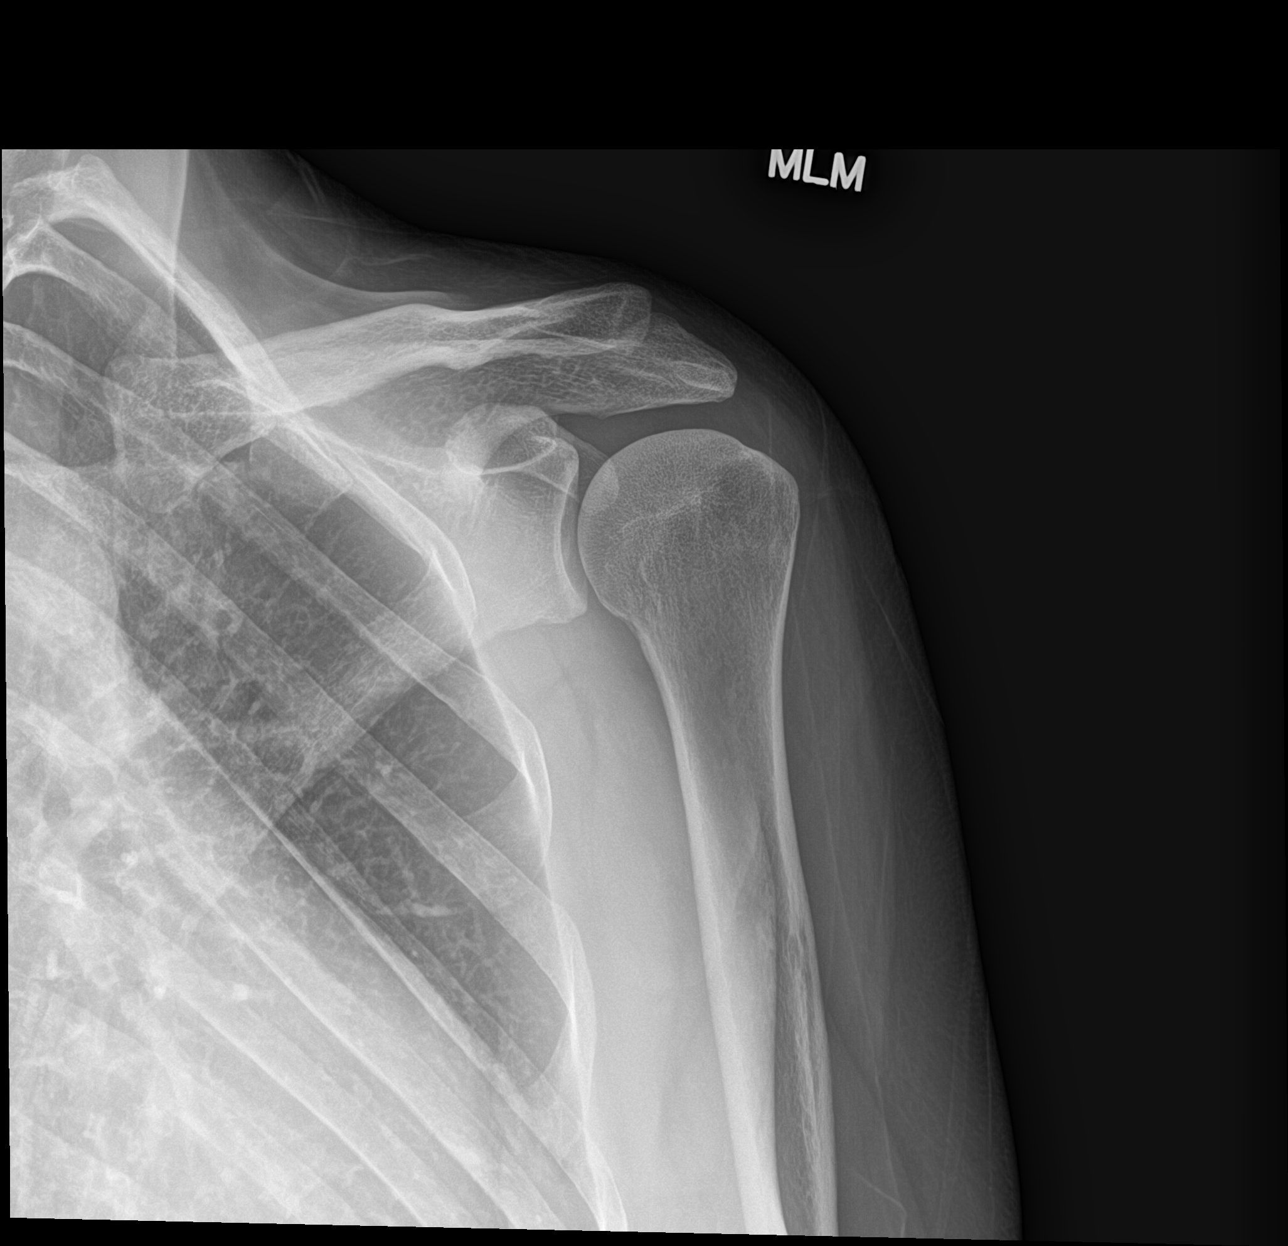

[shoulder y view]
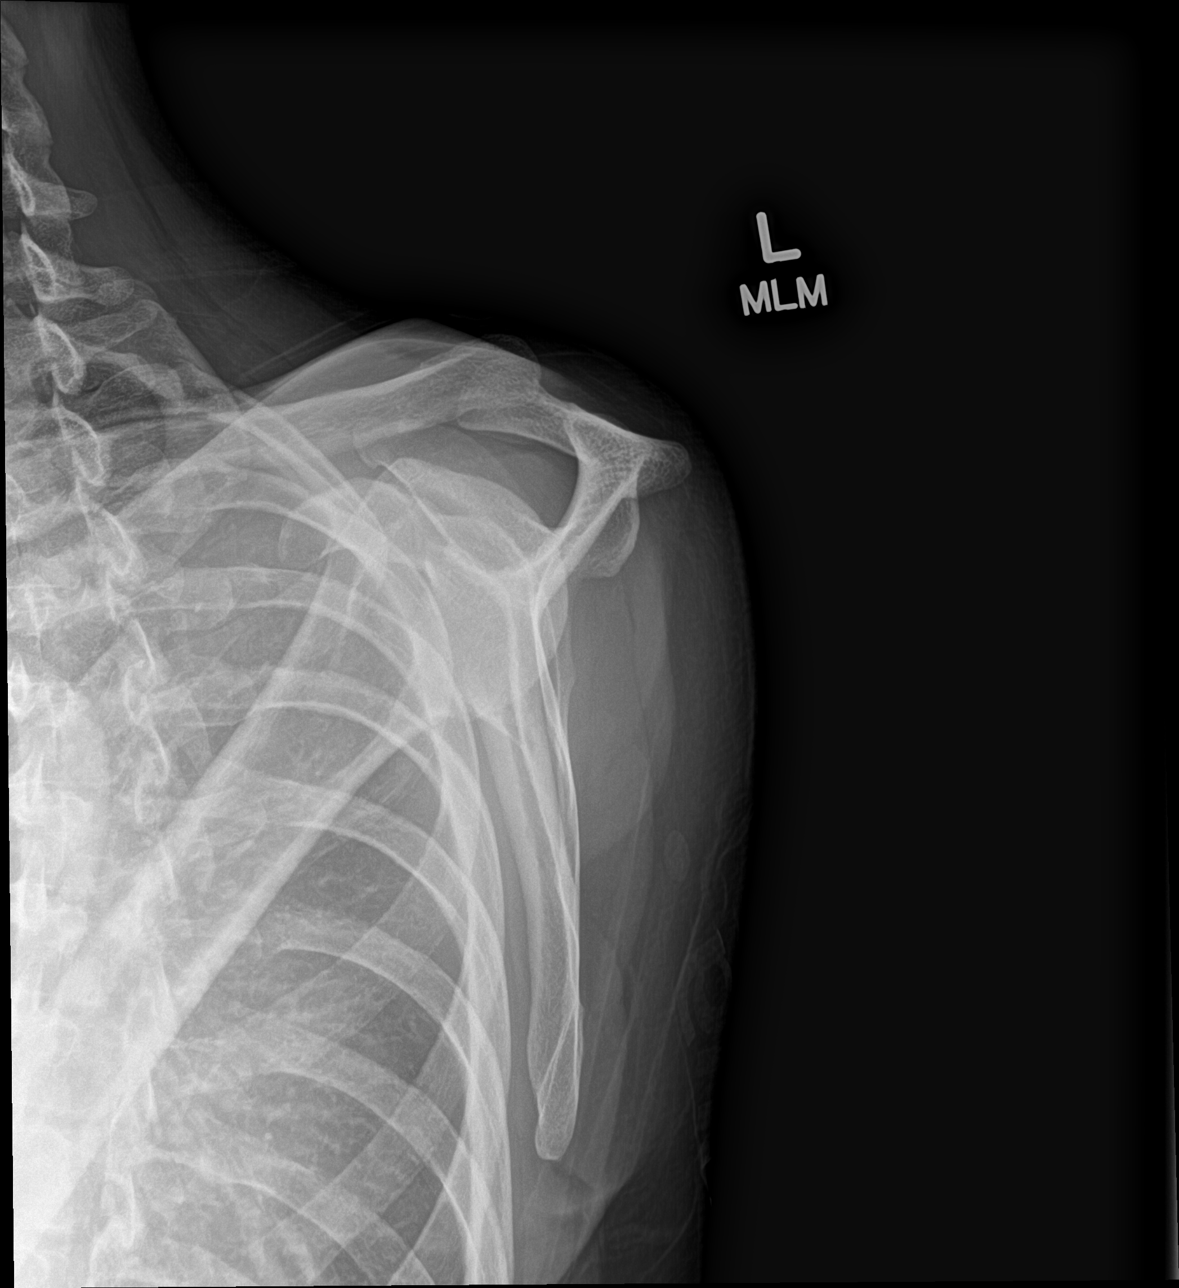

[shoulder axillary]
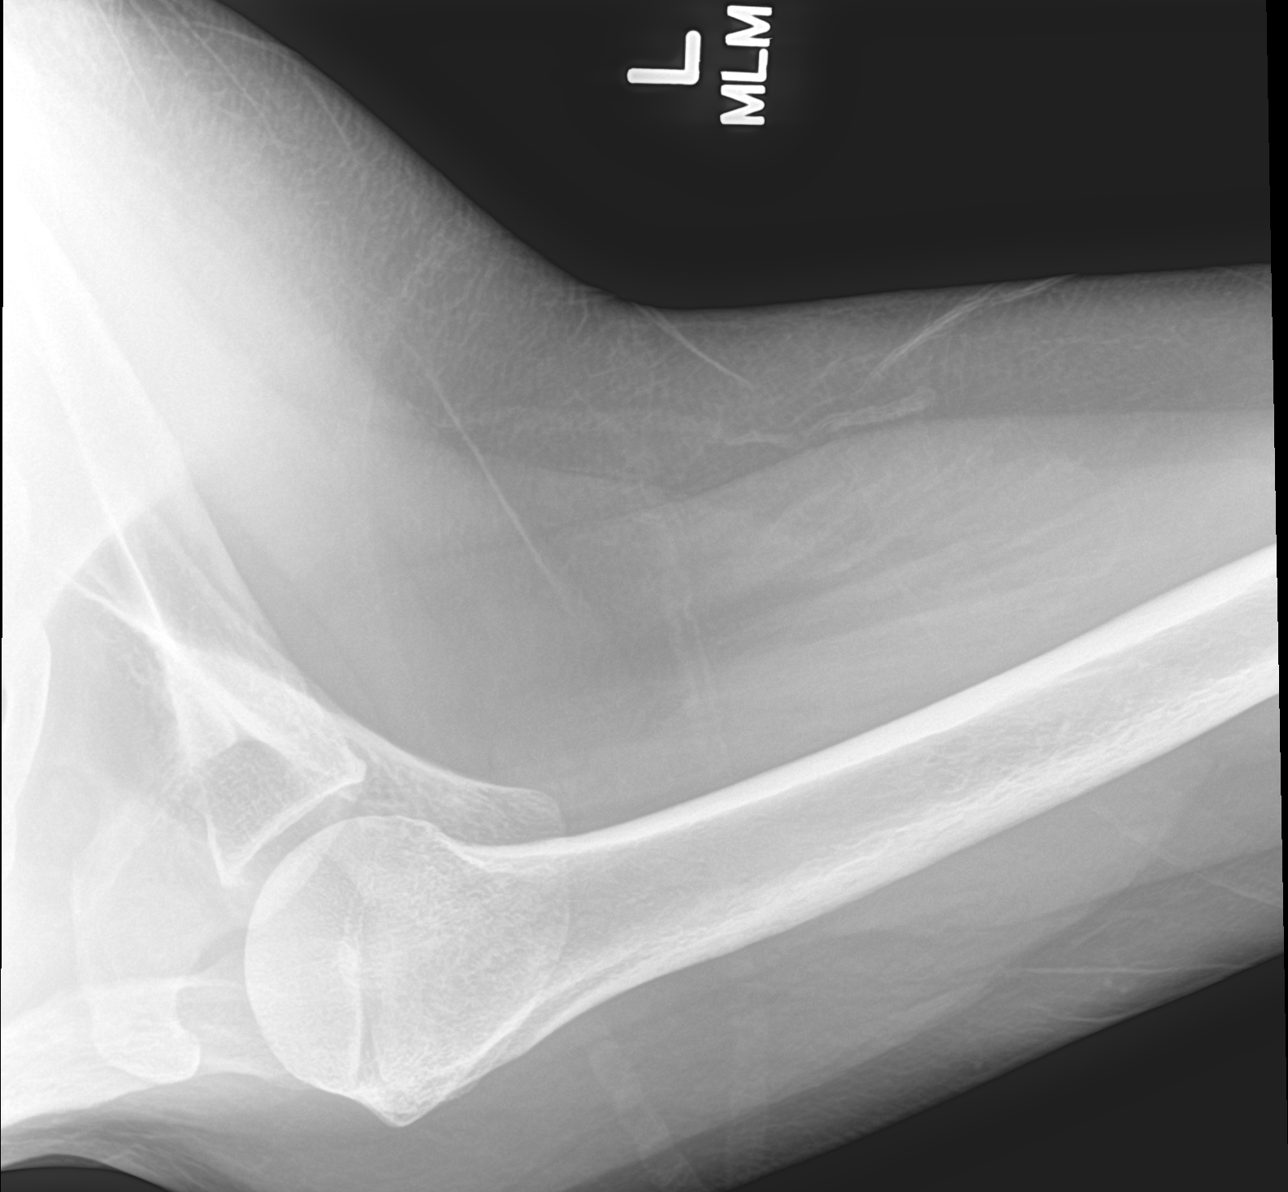

[3 of 3 positions shown; findings below may reference images not displayed]

FINDINGS: There is no evidence of fracture or dislocation. There is no
evidence of arthropathy or other focal bone abnormality. Soft
tissues are unremarkable.
IMPRESSION: Negative.

## 2023-04-15 ENCOUNTER — Other Ambulatory Visit: Payer: Self-pay

## 2023-08-27 ENCOUNTER — Ambulatory Visit (INDEPENDENT_AMBULATORY_CARE_PROVIDER_SITE_OTHER): Admitting: Adult Health

## 2023-08-27 ENCOUNTER — Encounter: Payer: Self-pay | Admitting: Adult Health

## 2023-08-27 ENCOUNTER — Encounter (HOSPITAL_COMMUNITY): Payer: Self-pay | Admitting: *Deleted

## 2023-08-27 VITALS — BP 129/86 | HR 82 | Ht 65.0 in | Wt 223.0 lb

## 2023-08-27 DIAGNOSIS — N6315 Unspecified lump in the right breast, overlapping quadrants: Secondary | ICD-10-CM | POA: Diagnosis not present

## 2023-08-27 DIAGNOSIS — N644 Mastodynia: Secondary | ICD-10-CM | POA: Diagnosis not present

## 2023-08-27 DIAGNOSIS — Z9882 Breast implant status: Secondary | ICD-10-CM

## 2023-08-27 NOTE — Progress Notes (Signed)
  Subjective:     Patient ID: Mary Pham, female   DOB: 19-Jun-1985, 38 y.o.   MRN: [id number]  HPI Mary Pham is a 38 year old white female,married, G1P1001 in complaining of pain in right breast, started Friday night, she has implants.     Component Value Date/Time   DIAGPAP  03/19/2022 1437    - Negative for Intraepithelial Lesions or Malignancy (NILM)   DIAGPAP - Benign reactive/reparative changes 03/19/2022 1437   DIAGPAP  03/27/2019 0857    - Negative for intraepithelial lesion or malignancy (NILM)   HPVHIGH Negative 03/19/2022 1437   HPVHIGH Negative 03/27/2019 0857   ADEQPAP  03/19/2022 1437    Satisfactory for evaluation; transformation zone component PRESENT.   ADEQPAP  03/27/2019 0857    Satisfactory for evaluation; transformation zone component PRESENT.    PCP is Dr Toribio  Review of Systems +pain in right breast, started Friday night, she has implants.   Reviewed past medical,surgical, social and family history. Reviewed medications and allergies.  Objective:   Physical Exam BP 129/86 (BP Location: Right Arm, Patient Position: Sitting, Cuff Size: Large)   Pulse 82   Ht 5' 5 (1.651 m)   Wt 223 lb (101.2 kg)   BMI 37.11 kg/m      Skin warm and dry,  Breasts:no dominate palpable mass, retraction or nipple discharge on the left, has implant, on the right, no retraction or nipple discharge, has tenderness and pea sized mass at 6 0'clock 2 FB from areola.  Fall risk is low  [facility] - 08/27/23 1608       Pregnancy Intention Screening   Does the patient want to become pregnant in the next year? No    Does the patient's partner want to become pregnant in the next year? No    Would the patient like to discuss contraceptive options today? No      Contraception Wrap Up   Current Method IUD or IUS    End Method IUD or IUS    Contraception Counseling Provided Yes          Assessment:      1. Breast pain, right (Primary) +pain right breast since Friday Do not wear  under wire and decrease caffeine Will get diagnostic mammogram and US  at [facility] to assess  - US  LIMITED ULTRASOUND INCLUDING AXILLA RIGHT BREAST; Future - US  LIMITED ULTRASOUND INCLUDING AXILLA LEFT BREAST ; Future - MM 3D DIAGNOSTIC MAMMOGRAM BILATERAL BREAST W/IMPLANT; Future  2. Mass overlapping multiple quadrants of right breast +pea sized mass at 6 0'clock 2 FB from areola. - US  LIMITED ULTRASOUND INCLUDING AXILLA RIGHT BREAST; Future - US  LIMITED ULTRASOUND INCLUDING AXILLA LEFT BREAST ; Future - MM 3D DIAGNOSTIC MAMMOGRAM BILATERAL BREAST W/IMPLANT; Future  3. History of bilateral breast implants - US  LIMITED ULTRASOUND INCLUDING AXILLA RIGHT BREAST; Future - US  LIMITED ULTRASOUND INCLUDING AXILLA LEFT BREAST ; Future - MM 3D DIAGNOSTIC MAMMOGRAM BILATERAL BREAST W/IMPLANT; Future     Plan:     Follow up prn

## 2023-08-28 ENCOUNTER — Telehealth: Payer: Self-pay | Admitting: Adult Health

## 2023-08-28 NOTE — Telephone Encounter (Signed)
 Pt aware that diagnostic  mammogram and US  scheduled for 09/10/23 at 2:20 pm at [facility]

## 2023-09-10 ENCOUNTER — Ambulatory Visit (HOSPITAL_COMMUNITY)

## 2023-09-10 ENCOUNTER — Other Ambulatory Visit (HOSPITAL_COMMUNITY)

## 2023-09-10 ENCOUNTER — Encounter (HOSPITAL_COMMUNITY)

## 2023-09-13 ENCOUNTER — Encounter: Payer: Self-pay | Admitting: Radiology

## 2023-09-24 ENCOUNTER — Ambulatory Visit (HOSPITAL_COMMUNITY): Attending: Adult Health

## 2023-09-24 ENCOUNTER — Inpatient Hospital Stay (HOSPITAL_COMMUNITY): Admission: RE | Admit: 2023-09-24 | Source: Ambulatory Visit

## 2023-09-24 ENCOUNTER — Ambulatory Visit (HOSPITAL_COMMUNITY): Admission: RE | Admit: 2023-09-24 | Source: Ambulatory Visit

## 2023-10-22 ENCOUNTER — Encounter (HOSPITAL_COMMUNITY): Payer: Self-pay | Admitting: *Deleted

## 2023-11-06 ENCOUNTER — Ambulatory Visit: Admitting: Surgical

## 2023-11-06 ENCOUNTER — Other Ambulatory Visit (INDEPENDENT_AMBULATORY_CARE_PROVIDER_SITE_OTHER): Payer: Self-pay

## 2023-11-06 ENCOUNTER — Encounter: Payer: Self-pay | Admitting: Surgical

## 2023-11-06 DIAGNOSIS — M25572 Pain in left ankle and joints of left foot: Secondary | ICD-10-CM

## 2023-11-09 ENCOUNTER — Encounter: Payer: Self-pay | Admitting: Surgical
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
## 2023-11-13 ENCOUNTER — Ambulatory Visit: Admitting: Surgical

## 2023-11-25 ENCOUNTER — Encounter: Payer: Self-pay | Admitting: Radiology

## 2024-01-07 ENCOUNTER — Other Ambulatory Visit (HOSPITAL_BASED_OUTPATIENT_CLINIC_OR_DEPARTMENT_OTHER): Payer: Self-pay

## 2024-01-07 MED ORDER — KETOCONAZOLE 2 % EX SHAM
MEDICATED_SHAMPOO | CUTANEOUS | 11 refills | Status: AC
  Filled 2024-01-07 – 2024-02-18 (×2): qty 120, 30d supply, fill #0

## 2024-01-07 MED ORDER — SILVER SULFADIAZINE 1 % EX CREA
TOPICAL_CREAM | CUTANEOUS | 11 refills | Status: AC
  Filled 2024-01-07: qty 400, 30d supply, fill #0

## 2024-01-07 MED ORDER — MINOCYCLINE HCL 100 MG PO CAPS
100.0000 mg | ORAL_CAPSULE | Freq: Two times a day (BID) | ORAL | 11 refills | Status: AC
  Filled 2024-01-07 – 2024-02-18 (×2): qty 60, 30d supply, fill #0

## 2024-01-07 MED ORDER — MUPIROCIN 2 % EX OINT
TOPICAL_OINTMENT | CUTANEOUS | 11 refills | Status: AC
  Filled 2024-01-07: qty 22, 30d supply, fill #0

## 2024-01-07 MED ORDER — TRIAMCINOLONE ACETONIDE 0.1 % EX CREA
TOPICAL_CREAM | CUTANEOUS | 11 refills | Status: AC
  Filled 2024-01-07: qty 454, 30d supply, fill #0

## 2024-01-08 ENCOUNTER — Other Ambulatory Visit (HOSPITAL_BASED_OUTPATIENT_CLINIC_OR_DEPARTMENT_OTHER): Payer: Self-pay

## 2024-01-14 ENCOUNTER — Other Ambulatory Visit (HOSPITAL_BASED_OUTPATIENT_CLINIC_OR_DEPARTMENT_OTHER): Payer: Self-pay

## 2024-01-14 ENCOUNTER — Other Ambulatory Visit (HOSPITAL_COMMUNITY): Payer: Self-pay

## 2024-01-29 ENCOUNTER — Ambulatory Visit: Admitting: Dermatology

## 2024-02-18 ENCOUNTER — Other Ambulatory Visit: Payer: Self-pay

## 2024-02-18 ENCOUNTER — Other Ambulatory Visit (HOSPITAL_COMMUNITY): Payer: Self-pay

## 2024-02-18 ENCOUNTER — Other Ambulatory Visit (HOSPITAL_BASED_OUTPATIENT_CLINIC_OR_DEPARTMENT_OTHER): Payer: Self-pay
